# Patient Record
Sex: Female | Born: 1935 | Race: White | Hispanic: No | Smoking: Former smoker
Health system: Southern US, Community
[De-identification: ages and names within clinical notes are randomized; demographics above are authoritative.]

## PROBLEM LIST (undated history)

## (undated) DIAGNOSIS — I10 Essential (primary) hypertension: Secondary | ICD-10-CM

## (undated) DIAGNOSIS — E785 Hyperlipidemia, unspecified: Secondary | ICD-10-CM

## (undated) DIAGNOSIS — E079 Disorder of thyroid, unspecified: Secondary | ICD-10-CM

## (undated) HISTORY — PX: CATARACT EXTRACTION, BILATERAL: SHX1313

## (undated) HISTORY — DX: Essential (primary) hypertension: I10

## (undated) HISTORY — PX: BUNIONECTOMY: SHX129

## (undated) HISTORY — DX: Disorder of thyroid, unspecified: E07.9

## (undated) HISTORY — PX: TONSILLECTOMY AND ADENOIDECTOMY: SHX28

## (undated) HISTORY — DX: Hyperlipidemia, unspecified: E78.5

---
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
## 1985-07-13 HISTORY — PX: ABDOMINAL HYSTERECTOMY: SHX81

## 1998-07-13 DIAGNOSIS — I10 Essential (primary) hypertension: Secondary | ICD-10-CM | POA: Insufficient documentation

## 2006-02-01 ENCOUNTER — Ambulatory Visit: Payer: Self-pay | Admitting: Ophthalmology

## 2006-02-08 ENCOUNTER — Ambulatory Visit: Payer: Self-pay | Admitting: Ophthalmology

## 2006-08-18 ENCOUNTER — Ambulatory Visit: Payer: Self-pay | Admitting: Gastroenterology

## 2006-08-18 LAB — HM COLONOSCOPY

## 2007-03-28 ENCOUNTER — Other Ambulatory Visit: Payer: Self-pay

## 2007-03-28 ENCOUNTER — Ambulatory Visit: Payer: Self-pay | Admitting: Ophthalmology

## 2007-04-04 ENCOUNTER — Ambulatory Visit: Payer: Self-pay | Admitting: Ophthalmology

## 2007-07-14 HISTORY — PX: OTHER SURGICAL HISTORY: SHX169

## 2008-05-22 ENCOUNTER — Ambulatory Visit: Payer: Self-pay | Admitting: Cardiology

## 2008-05-22 ENCOUNTER — Ambulatory Visit: Payer: Self-pay | Admitting: Obstetrics and Gynecology

## 2008-05-28 ENCOUNTER — Ambulatory Visit: Payer: Self-pay | Admitting: Obstetrics and Gynecology

## 2009-02-06 ENCOUNTER — Ambulatory Visit: Payer: Self-pay | Admitting: Family Medicine

## 2011-10-22 ENCOUNTER — Emergency Department: Payer: Self-pay | Admitting: Emergency Medicine

## 2011-10-22 HISTORY — PX: OTHER SURGICAL HISTORY: SHX169

## 2011-10-22 LAB — TROPONIN I: Troponin-I: <0.02 ng/mL

## 2011-10-22 LAB — COMPREHENSIVE METABOLIC PANEL
Albumin: 3.7 g/dL (ref 3.4–5.0)
Alkaline Phosphatase: 50 U/L (ref 50–136)
Anion Gap: 12 (ref 7–16)
BUN: 13 mg/dL (ref 7–18)
Bilirubin,Total: 0.5 mg/dL (ref 0.2–1.0)
Chloride: 99 mmol/L (ref 98–107)
Creatinine: 0.8 mg/dL (ref 0.60–1.30)
EGFR (African American): >60
Osmolality: 279 (ref 275–301)
Potassium: 3 mmol/L — ABNORMAL LOW (ref 3.5–5.1)
SGPT (ALT): 19 U/L
Sodium: 139 mmol/L (ref 136–145)

## 2011-10-22 LAB — CBC
MCH: 29.7 pg (ref 26.0–34.0)
MCHC: 32.2 g/dL (ref 32.0–36.0)
MCV: 92 fL (ref 80–100)
Platelet: 219 10*3/uL (ref 150–440)

## 2011-10-23 ENCOUNTER — Ambulatory Visit: Payer: Self-pay | Admitting: Family Medicine

## 2012-07-26 LAB — HM DEXA SCAN: HM DEXA SCAN: NORMAL

## 2012-08-06 ENCOUNTER — Emergency Department: Payer: Self-pay | Admitting: Emergency Medicine

## 2013-10-25 ENCOUNTER — Ambulatory Visit: Payer: Self-pay | Admitting: Family Medicine

## 2014-04-06 ENCOUNTER — Ambulatory Visit: Payer: Self-pay | Admitting: Family Medicine

## 2014-04-09 LAB — LIPID PANEL
CHOLESTEROL: 210 mg/dL — AB (ref 0–200)
HDL: 81 mg/dL — AB (ref 35–70)
LDL CALC: 113 mg/dL
TRIGLYCERIDES: 81 mg/dL (ref 40–160)

## 2014-04-09 LAB — TSH: TSH: 1.41 u[IU]/mL (ref 0.41–5.90)

## 2014-04-09 LAB — BASIC METABOLIC PANEL
BUN: 10 mg/dL (ref 4–21)
Creatinine: 0.7 mg/dL (ref 0.5–1.1)
Glucose: 97 mg/dL
Potassium: 4.4 mmol/L (ref 3.4–5.3)
Sodium: 137 mmol/L (ref 137–147)

## 2014-11-27 ENCOUNTER — Other Ambulatory Visit: Payer: Self-pay | Admitting: Physical Medicine and Rehabilitation

## 2014-11-27 DIAGNOSIS — M5441 Lumbago with sciatica, right side: Secondary | ICD-10-CM

## 2014-11-27 DIAGNOSIS — M5442 Lumbago with sciatica, left side: Principal | ICD-10-CM

## 2014-12-04 ENCOUNTER — Ambulatory Visit
Admission: RE | Admit: 2014-12-04 | Discharge: 2014-12-04 | Disposition: A | Discharge status: Home / Self Care | Payer: Medicare Other | Source: Ambulatory Visit | Attending: Physical Medicine and Rehabilitation | Admitting: Physical Medicine and Rehabilitation

## 2014-12-04 DIAGNOSIS — M545 Low back pain: Secondary | ICD-10-CM | POA: Diagnosis present

## 2014-12-04 DIAGNOSIS — M79605 Pain in left leg: Secondary | ICD-10-CM | POA: Diagnosis present

## 2014-12-04 DIAGNOSIS — M4806 Spinal stenosis, lumbar region: Secondary | ICD-10-CM | POA: Insufficient documentation

## 2014-12-04 DIAGNOSIS — M5441 Lumbago with sciatica, right side: Secondary | ICD-10-CM

## 2014-12-04 DIAGNOSIS — M5186 Other intervertebral disc disorders, lumbar region: Secondary | ICD-10-CM | POA: Diagnosis not present

## 2014-12-04 DIAGNOSIS — M47896 Other spondylosis, lumbar region: Secondary | ICD-10-CM | POA: Insufficient documentation

## 2014-12-04 DIAGNOSIS — M5386 Other specified dorsopathies, lumbar region: Secondary | ICD-10-CM | POA: Diagnosis not present

## 2014-12-04 DIAGNOSIS — M79604 Pain in right leg: Secondary | ICD-10-CM | POA: Diagnosis present

## 2014-12-04 DIAGNOSIS — M5442 Lumbago with sciatica, left side: Secondary | ICD-10-CM

## 2015-01-01 DIAGNOSIS — M5136 Other intervertebral disc degeneration, lumbar region: Secondary | ICD-10-CM | POA: Insufficient documentation

## 2015-01-01 DIAGNOSIS — M48061 Spinal stenosis, lumbar region without neurogenic claudication: Secondary | ICD-10-CM | POA: Insufficient documentation

## 2015-03-02 ENCOUNTER — Other Ambulatory Visit: Payer: Self-pay | Admitting: Family Medicine

## 2015-04-04 ENCOUNTER — Other Ambulatory Visit: Payer: Self-pay | Admitting: *Deleted

## 2015-04-04 MED ORDER — LEVOTHYROXINE SODIUM 88 MCG PO TABS: 88.0000 ug | ORAL_TABLET | Freq: Every day | ORAL | Status: DC

## 2015-04-04 NOTE — Telephone Encounter (Signed)
Refill request for levothyroxine 88 mcg qd Last filled by MD on- 04/05/2014 #30 x11 Last Appt: 09/28/2014 Next Appt: none Please advise refill?

## 2015-06-20 ENCOUNTER — Other Ambulatory Visit: Payer: Self-pay | Admitting: Family Medicine

## 2015-09-16 DIAGNOSIS — M545 Low back pain, unspecified: Secondary | ICD-10-CM | POA: Insufficient documentation

## 2015-09-16 DIAGNOSIS — G4700 Insomnia, unspecified: Secondary | ICD-10-CM | POA: Insufficient documentation

## 2015-09-16 DIAGNOSIS — E876 Hypokalemia: Secondary | ICD-10-CM | POA: Insufficient documentation

## 2015-09-16 DIAGNOSIS — M4316 Spondylolisthesis, lumbar region: Secondary | ICD-10-CM | POA: Insufficient documentation

## 2015-09-16 DIAGNOSIS — E039 Hypothyroidism, unspecified: Secondary | ICD-10-CM | POA: Insufficient documentation

## 2015-09-16 DIAGNOSIS — M1990 Unspecified osteoarthritis, unspecified site: Secondary | ICD-10-CM | POA: Insufficient documentation

## 2015-09-17 ENCOUNTER — Encounter: Payer: Self-pay | Admitting: Family Medicine

## 2015-09-17 ENCOUNTER — Ambulatory Visit (INDEPENDENT_AMBULATORY_CARE_PROVIDER_SITE_OTHER): Payer: Medicare Other | Admitting: Family Medicine

## 2015-09-17 VITALS — BP 128/60 | HR 64 | Temp 97.7°F | Resp 16 | Ht 62.0 in | Wt 115.0 lb

## 2015-09-17 DIAGNOSIS — E039 Hypothyroidism, unspecified: Secondary | ICD-10-CM

## 2015-09-17 DIAGNOSIS — I10 Essential (primary) hypertension: Secondary | ICD-10-CM

## 2015-09-17 NOTE — Progress Notes (Signed)
Patient: Isabel Myers Female    DOB: May 29, 1930   82 y.o.   MRN: [id number] Visit Date: 09/17/2015  Today's Provider: Mila Merry, MD   Chief Complaint  Patient presents with  . Hypothyroidism    follow up  . Hypertension    follow up   Subjective:    HPI  Hypertension, follow-up:  BP Readings from Last 3 Encounters:  09/17/15 128/60  09/28/14 120/60    She was last seen for hypertension 1 years ago.  BP at that visit was 120/60. Management since that visit includes no changes. She reports good compliance with treatment. She is not having side effects.  She is exercising. She is adherent to low salt diet.   Outside blood pressures are not being checked. She is experiencing none.  Patient denies chest pain, chest pressure/discomfort, claudication, dyspnea, exertional chest pressure/discomfort, fatigue, irregular heart beat, lower extremity edema, near-syncope, orthopnea, palpitations, paroxysmal nocturnal dyspnea, syncope and tachypnea.   Cardiovascular risk factors include advanced age (older than 82 for men, 104 for women) and hypertension.      Weight trend: stable Wt Readings from Last 3 Encounters:  09/17/15 115 lb (52.164 kg)  09/28/14 112 lb (50.803 kg)    Current diet: in general, a "healthy" diet    ------------------------------------------------------------------------  Follow up Hypothyroidism:  Last office visit was 1 year ago and no changes were made. Patient reports good compliance with treatment.     No Known Allergies Previous Medications   AMLODIPINE (NORVASC) 2.5 MG TABLET    TAKE 1 TABLET EVERY DAY   CALCIUM CARBONATE (CALCIUM 600 PO)    Take 1 tablet by mouth daily.   HYDROCHLOROTHIAZIDE (HYDRODIURIL) 25 MG TABLET    TAKE 1 TABLET EVERY DAY   LEVOTHYROXINE (SYNTHROID, LEVOTHROID) 88 MCG TABLET    Take 1 tablet (88 mcg total) by mouth daily.   TRIAZOLAM (HALCION) 0.25 MG TABLET    Take 0.5-1 tablets by mouth at bedtime as  needed.    Review of Systems  Constitutional: Negative for fever, chills, appetite change and fatigue.  Respiratory: Negative for chest tightness and shortness of breath.   Cardiovascular: Negative for chest pain, palpitations and leg swelling.  Gastrointestinal: Negative for nausea, vomiting and abdominal pain.  Musculoskeletal: Positive for back pain.  Neurological: Negative for dizziness, weakness and headaches.    Social History  Substance Use Topics  . Smoking status: Former Smoker -- 0.50 packs/day for 15 years    Types: Cigarettes  . Smokeless tobacco: Not on file  . Alcohol Use: 0.0 oz/week    0 Standard drinks or equivalent per week     Comment: drinks wine most days   Objective:   BP 128/60 mmHg  Pulse 64  Temp(Src) 97.7 F (36.5 C) (Oral)  Resp 16  Ht  (1.575 m)  Wt 115 lb (52.164 kg)  BMI 21.03 kg/m2  SpO2 97%  Physical Exam   General Appearance:    Alert, cooperative, no distress  Eyes:    PERRL, conjunctiva/corneas clear, EOM's intact       Lungs:     Clear to auscultation bilaterally, respirations unlabored  Heart:    Regular rate and rhythm  Neurologic:   Awake, alert, oriented x 3. No apparent focal neurological           defect.           Assessment & Plan:     1. Essential (primary) hypertension Well controlled.  Continue current medications.   - Renal function panel  2. Hypothyroidism, unspecified hypothyroidism type Feels well on current dose of levothyroxine.  - TSH       Mila Merryonald Fisher, MD  [facility]

## 2015-09-18 LAB — RENAL FUNCTION PANEL
Albumin: 4.5 g/dL (ref 3.5–4.7)
BUN / CREAT RATIO: 16 (ref 11–26)
BUN: 13 mg/dL (ref 8–27)
CALCIUM: 9.9 mg/dL (ref 8.7–10.3)
CO2: 26 mmol/L (ref 18–29)
CREATININE: 0.79 mg/dL (ref 0.57–1.00)
Chloride: 93 mmol/L — ABNORMAL LOW (ref 96–106)
GFR calc Af Amer: 80 mL/min/{1.73_m2} (ref >59)
GFR calc non Af Amer: 69 mL/min/{1.73_m2} (ref >59)
Glucose: 87 mg/dL (ref 65–99)
Phosphorus: 3.7 mg/dL (ref 2.5–4.5)
Potassium: 4.4 mmol/L (ref 3.5–5.2)
Sodium: 138 mmol/L (ref 134–144)

## 2015-09-18 LAB — TSH: TSH: 1.15 u[IU]/mL (ref 0.450–4.500)

## 2015-09-20 ENCOUNTER — Encounter: Payer: Self-pay | Admitting: Family Medicine

## 2015-10-07 ENCOUNTER — Encounter: Payer: Self-pay | Admitting: Family Medicine

## 2015-10-07 ENCOUNTER — Ambulatory Visit (INDEPENDENT_AMBULATORY_CARE_PROVIDER_SITE_OTHER): Payer: Medicare Other | Admitting: Family Medicine

## 2015-10-07 VITALS — BP 110/52 | HR 64 | Temp 98.0°F | Resp 16 | Wt 116.0 lb

## 2015-10-07 DIAGNOSIS — M545 Low back pain, unspecified: Secondary | ICD-10-CM

## 2015-10-07 DIAGNOSIS — R509 Fever, unspecified: Secondary | ICD-10-CM | POA: Diagnosis not present

## 2015-10-07 LAB — POCT INFLUENZA A/B
INFLUENZA A, POC: NEGATIVE
Influenza B, POC: NEGATIVE

## 2015-10-07 LAB — POCT URINALYSIS DIPSTICK
BILIRUBIN UA: NEGATIVE
Glucose, UA: NEGATIVE
Ketones, UA: NEGATIVE
Leukocytes, UA: NEGATIVE
NITRITE UA: NEGATIVE
PH UA: 6
PROTEIN UA: NEGATIVE
SPEC GRAV UA: 1.01
UROBILINOGEN UA: 0.2

## 2015-10-07 NOTE — Progress Notes (Signed)
Patient: Isabel Myers Female    DOB: Mar 27, 1930   82 y.o.   MRN: [id number] Visit Date: 10/07/2015  Today's Provider: Mila Merry, MD   Chief Complaint  Patient presents with  . Back Pain   Subjective:    Back Pain This is a new problem. Episode onset: 2-3 days ago. The problem occurs constantly. The problem has been gradually worsening since onset. Quality: sharp. The pain does not radiate. The pain is severe. The symptoms are aggravated by twisting and bending. Associated symptoms include a fever (up to 102 yesterday). Pertinent negatives include no abdominal pain, bladder incontinence, bowel incontinence, chest pain, dysuria, headaches, leg pain, numbness, paresis, paresthesias, pelvic pain, tingling or weakness. Treatments tried: Tylenol. The treatment provided mild relief.  Back feels better today since getting up and around. Took some Tylenol yesterday but none today.      No Known Allergies Previous Medications   AMLODIPINE (NORVASC) 2.5 MG TABLET    TAKE 1 TABLET EVERY DAY   CALCIUM CARBONATE (CALCIUM 600 PO)    Take 1 tablet by mouth daily.   HYDROCHLOROTHIAZIDE (HYDRODIURIL) 25 MG TABLET    TAKE 1 TABLET EVERY DAY   LEVOTHYROXINE (SYNTHROID, LEVOTHROID) 88 MCG TABLET    Take 1 tablet (88 mcg total) by mouth daily.   TRIAZOLAM (HALCION) 0.25 MG TABLET    Take 0.5-1 tablets by mouth at bedtime as needed.    Review of Systems  Constitutional: Positive for fever (up to 102 yesterday). Negative for chills, diaphoresis, appetite change and fatigue.  Respiratory: Positive for cough. Negative for chest tightness and shortness of breath.   Cardiovascular: Negative for chest pain and palpitations.  Gastrointestinal: Negative for nausea, vomiting, abdominal pain and bowel incontinence.  Genitourinary: Negative for bladder incontinence, dysuria and pelvic pain.  Musculoskeletal: Positive for back pain.  Neurological: Negative for dizziness, tingling, weakness, numbness,  headaches and paresthesias.    Social History  Substance Use Topics  . Smoking status: Former Smoker -- 0.50 packs/day for 15 years    Types: Cigarettes  . Smokeless tobacco: Not on file     Comment: Quit in the 1970s  . Alcohol Use: 0.0 oz/week    0 Standard drinks or equivalent per week     Comment: drinks wine most days   Objective:   BP 110/52 mmHg  Pulse 64  Temp(Src) 98 F (36.7 C) (Oral)  Resp 16  Wt 116 lb (52.617 kg)  SpO2 98%  Physical Exam  General Appearance:    Alert, cooperative, no distress  HENT:   ENT exam normal, no neck nodes or sinus tenderness  Eyes:    PERRL, conjunctiva/corneas clear, EOM's intact       Lungs:     Clear to auscultation bilaterally, respirations unlabored  Heart:    Regular rate and rhythm  Neurologic:   Awake, alert, oriented x 3. No apparent focal neurological           defect.         Results for orders placed or performed in visit on 10/07/15  POCT urinalysis dipstick  Result Value Ref Range   Color, UA yellow    Clarity, UA clear    Glucose, UA negative    Bilirubin, UA negative    Ketones, UA negative    Spec Grav, UA 1.010    Blood, UA trace (non hemolyzed)    pH, UA 6.0    Protein, UA negative    Urobilinogen,  UA 0.2    Nitrite, UA negative    Leukocytes, UA Negative Negative  POCT Influenza A/B  Result Value Ref Range   Influenza A, POC Negative Negative   Influenza B, POC Negative Negative       Assessment & Plan:     1. Fever, unspecified fever cause Resolved today. Most likely brief mild respiratory virus. Call if any more fever, coughing, or shortness of breath.  - POCT Influenza A/B  2. Midline low back pain without sciatica Normal exam. Mostly resolved today. Probably related to fever yesterday. Call if it recurs.  - POCT urinalysis dipstick        Mila Merryonald Fisher, MD  [facility]

## 2015-10-10 ENCOUNTER — Other Ambulatory Visit: Payer: Self-pay | Admitting: Family Medicine

## 2016-02-20 ENCOUNTER — Other Ambulatory Visit: Payer: Self-pay | Admitting: Family Medicine

## 2016-03-03 ENCOUNTER — Encounter: Payer: Self-pay | Admitting: Family Medicine

## 2016-03-03 ENCOUNTER — Ambulatory Visit (INDEPENDENT_AMBULATORY_CARE_PROVIDER_SITE_OTHER): Payer: Medicare Other | Admitting: Family Medicine

## 2016-03-03 VITALS — BP 130/60 | HR 64 | Temp 97.7°F | Resp 16 | Wt 114.0 lb

## 2016-03-03 DIAGNOSIS — I10 Essential (primary) hypertension: Secondary | ICD-10-CM | POA: Diagnosis not present

## 2016-03-03 DIAGNOSIS — E039 Hypothyroidism, unspecified: Secondary | ICD-10-CM

## 2016-03-03 NOTE — Progress Notes (Signed)
Patient: Isabel Myers Female    DOB: Oct 02, 1929   82 y.o.   MRN: [id number] Visit Date: 03/03/2016  Today's Provider: Mila Merryonald Meoshia Billing, MD   Chief Complaint  Patient presents with  . Hypertension    follow  up  . Hypothyroidism    follow  up   Subjective:    HPI  Hypertension, follow-up:  BP Readings from Last 3 Encounters:  10/07/15 (!) 110/52  09/17/15 128/60  09/28/14 120/60    She was last seen for hypertension 5 months ago.  BP at that visit was 128/60. Management since that visit includes no changes. She reports good compliance with treatment. She is not having side effects.  She is exercising. She is adherent to low salt diet.   Outside blood pressures are being checked at the pharmacy. She is experiencing none.  Patient denies chest pain, chest pressure/discomfort, claudication, dyspnea, exertional chest pressure/discomfort, fatigue, irregular heart beat, lower extremity edema, near-syncope, orthopnea, palpitations, paroxysmal nocturnal dyspnea, syncope and tachypnea.   Cardiovascular risk factors include advanced age (older than 6855 for men, 6365 for women) and hypertension.  Use of agents associated with hypertension: thyroid hormones.     Weight trend: stable Wt Readings from Last 3 Encounters:  10/07/15 116 lb (52.6 kg)  09/17/15 115 lb (52.2 kg)  09/28/14 112 lb (50.8 kg)    Current diet: in general, a "healthy" diet    ------------------------------------------------------------------------ Follow up Hypothyroidism: Patient was last seen 5 months ago and no changes were made.  Patient reports good compliance with treatment and good tolerance.  Lab Results  Component Value Date   TSH 1.150 09/17/2015       No Known Allergies Current Meds  Medication Sig  . amLODipine (NORVASC) 2.5 MG tablet TAKE 1 TABLET EVERY DAY  . Calcium Carbonate (CALCIUM 600 PO) Take 1 tablet by mouth daily.  . hydrochlorothiazide (HYDRODIURIL) 25 MG tablet TAKE  1 TABLET EVERY DAY  . levothyroxine (SYNTHROID, LEVOTHROID) 88 MCG tablet TAKE 1 TABLET EVERY DAY  . triazolam (HALCION) 0.25 MG tablet Take 0.5-1 tablets by mouth at bedtime as needed.    Review of Systems  Constitutional: Negative for appetite change, chills, fatigue and fever.  Respiratory: Negative for chest tightness and shortness of breath.   Cardiovascular: Negative for chest pain and palpitations.  Gastrointestinal: Negative for abdominal pain, nausea and vomiting.  Endocrine: Negative for cold intolerance, heat intolerance, polydipsia, polyphagia and polyuria.  Musculoskeletal: Positive for arthralgias.  Neurological: Negative for dizziness and weakness.    Social History  Substance Use Topics  . Smoking status: Former Smoker    Packs/day: 0.50    Years: 15.00    Types: Cigarettes  . Smokeless tobacco: Never Used     Comment: Quit in the 1970s  . Alcohol use 0.0 oz/week     Comment: drinks wine most days   Objective:   BP 130/60 (BP Location: Left Arm, Patient Position: Sitting, Cuff Size: Normal)   Pulse 64   Temp 97.7 F (36.5 C) (Oral)   Resp 16   Wt 114 lb (51.7 kg)   BMI 20.85 kg/m   Physical Exam   General Appearance:    Alert, cooperative, no distress  Eyes:    PERRL, conjunctiva/corneas clear, EOM's intact       Lungs:     Clear to auscultation bilaterally, respirations unlabored  Heart:    Regular rate and rhythm  Neurologic:   Awake, alert, oriented x 3.  No apparent focal neurological           defect.        Fall Risk  03/03/2016  Falls in the past year? No   Depression screen PHQ 2/9 03/03/2016  Decreased Interest 0  Down, Depressed, Hopeless 0  PHQ - 2 Score 0       Assessment & Plan:     1. Hypothyroidism, unspecified hypothyroidism type Doing well Continue current medications.    2. Essential (primary) hypertension Well controlled.  Continue current medications.    Follow up in March 2018.      The entirety of the information  documented in the History of Present Illness, Review of Systems and Physical Exam were personally obtained by me. Portions of this information were initially documented by Awilda Billoshena Chambers, CMA and reviewed by me for thoroughness and accuracy.    Mila Merryonald Dyron Kawano, MD  [facility]

## 2016-04-09 ENCOUNTER — Other Ambulatory Visit: Payer: Self-pay | Admitting: Family Medicine

## 2016-04-09 NOTE — Telephone Encounter (Signed)
Refill request for amlodipine 2.05 mg qd Last filled by MD on- 03/02/2015 #30 x6 Last Appt: 03/03/2016 Next Appt: 09/01/2016 Please advise refill?

## 2016-04-28 ENCOUNTER — Telehealth: Payer: Self-pay | Admitting: Family Medicine

## 2016-04-28 NOTE — Telephone Encounter (Signed)
Called Pt to schedule AWV with [facility] only - knb °

## 2016-04-29 NOTE — Telephone Encounter (Signed)
Patient called back and declined scheduling AWV appointment.

## 2016-08-20 ENCOUNTER — Telehealth: Payer: Self-pay | Admitting: Family Medicine

## 2016-08-20 NOTE — Telephone Encounter (Signed)
Called Pt to schedule AWV with [facility] - knb °

## 2016-08-20 NOTE — Telephone Encounter (Signed)
Pt was confused about the AWV and the F/U appt that she already has scheduled. I tried to explain but it seemed to be confusing pt more. I advised pt I would leave her F/U appt as it was. Maybe when she comes in later this month someone in the office could try to get her schedule. I was afraid to get her more confused. Thanks TNP

## 2016-09-01 ENCOUNTER — Ambulatory Visit (INDEPENDENT_AMBULATORY_CARE_PROVIDER_SITE_OTHER): Payer: Medicare Other | Admitting: Family Medicine

## 2016-09-01 ENCOUNTER — Encounter: Payer: Self-pay | Admitting: Family Medicine

## 2016-09-01 VITALS — BP 130/60 | HR 78 | Temp 97.7°F | Resp 16 | Ht 62.0 in | Wt 116.0 lb

## 2016-09-01 DIAGNOSIS — E039 Hypothyroidism, unspecified: Secondary | ICD-10-CM | POA: Diagnosis not present

## 2016-09-01 DIAGNOSIS — I10 Essential (primary) hypertension: Secondary | ICD-10-CM

## 2016-09-01 NOTE — Progress Notes (Signed)
       Patient: Isabel GrumblesKathryn E Woodmansee Female    DOB: Mar 27, 1930   83 y.o.   MRN: [id number] Visit Date: 09/01/2016  Today's Provider: Mila Merryonald Foch Rosenwald, MD   Chief Complaint  Patient presents with  . Follow-up  . Hypertension  . Hypothyroidism   Subjective:    HPI   Hypothyroidism, unspecified hypothyroidism type From 03/03/2016-no changes.    Hypertension, follow-up:  BP Readings from Last 3 Encounters:  09/01/16 130/60  03/03/16 130/60  10/07/15 (!) 110/52    She was last seen for hypertension 6 months ago.  BP at that visit was 130/60. Management since that visit includes; no changes.She reports good compliance with treatment. She is not having side effects. none She is exercising. She is adherent to low salt diet.   Outside blood pressures are not checking. She is experiencing none.  Patient denies none.   Cardiovascular risk factors include none.  Use of agents associated with hypertension: none.   ----------------------------------------------------------------    No Known Allergies   Current Outpatient Prescriptions:  .  amLODipine (NORVASC) 2.5 MG tablet, TAKE 1 TABLET EVERY DAY, Disp: 30 tablet, Rfl: 6 .  Calcium Carbonate (CALCIUM 600 PO), Take 1 tablet by mouth daily., Disp: , Rfl:  .  hydrochlorothiazide (HYDRODIURIL) 25 MG tablet, TAKE 1 TABLET EVERY DAY, Disp: 30 tablet, Rfl: 12 .  levothyroxine (SYNTHROID, LEVOTHROID) 88 MCG tablet, TAKE 1 TABLET EVERY DAY, Disp: 30 tablet, Rfl: 12 .  triazolam (HALCION) 0.25 MG tablet, Take 0.5-1 tablets by mouth at bedtime as needed., Disp: , Rfl:   Review of Systems  Constitutional: Negative for appetite change, chills, fatigue and fever.  Respiratory: Negative for chest tightness and shortness of breath.   Cardiovascular: Negative for chest pain and palpitations.  Gastrointestinal: Negative for abdominal pain, nausea and vomiting.  Neurological: Positive for speech difficulty. Negative for dizziness and weakness.      Social History  Substance Use Topics  . Smoking status: Former Smoker    Packs/day: 0.50    Years: 15.00    Types: Cigarettes  . Smokeless tobacco: Never Used     Comment: Quit in the 1970s  . Alcohol use 0.0 oz/week     Comment: drinks wine most days   Objective:   BP 130/60 (BP Location: Right Arm, Patient Position: Sitting, Cuff Size: Normal)   Pulse 78   Temp 97.7 F (36.5 C) (Oral)   Resp 16   Ht 5\' 2"  (1.575 m)   Wt 116 lb (52.6 kg)   SpO2 98%   BMI 21.22 kg/m   Physical Exam   General Appearance:    Alert, cooperative, no distress  Eyes:    PERRL, conjunctiva/corneas clear, EOM's intact       Lungs:     Clear to auscultation bilaterally, respirations unlabored  Heart:    Regular rate and rhythm  Neurologic:   Awake, alert, oriented x 3. No apparent focal neurological           defect.           Assessment & Plan:     1. Essential (primary) hypertension  - Renal function panel  2. Hypothyroidism, unspecified type  - TSH       Mila Merryonald Chelbie Jarnagin, MD  [facility]

## 2016-09-02 LAB — RENAL FUNCTION PANEL
Albumin: 4.5 g/dL (ref 3.5–4.7)
BUN / CREAT RATIO: 19 (ref 12–28)
BUN: 15 mg/dL (ref 8–27)
CHLORIDE: 93 mmol/L — AB (ref 96–106)
CO2: 28 mmol/L (ref 18–29)
Calcium: 10 mg/dL (ref 8.7–10.3)
Creatinine, Ser: 0.79 mg/dL (ref 0.57–1.00)
GFR calc Af Amer: 79 (ref >59)
GFR calc non Af Amer: 69 (ref >59)
GLUCOSE: 97 mg/dL (ref 65–99)
POTASSIUM: 3.9 mmol/L (ref 3.5–5.2)
Phosphorus: 3.6 mg/dL (ref 2.5–4.5)
Sodium: 137 mmol/L (ref 134–144)

## 2016-09-02 LAB — TSH: TSH: 1.01 u[IU]/mL (ref 0.450–4.500)

## 2016-09-02 NOTE — Progress Notes (Signed)
Advised  ED 

## 2016-09-23 ENCOUNTER — Telehealth: Payer: Self-pay | Admitting: Family Medicine

## 2016-09-23 NOTE — Telephone Encounter (Signed)
Called Pt to schedule AWV with [facility] - knb °

## 2016-11-12 ENCOUNTER — Other Ambulatory Visit: Payer: Self-pay | Admitting: Family Medicine

## 2017-03-24 ENCOUNTER — Other Ambulatory Visit: Payer: Self-pay | Admitting: Family Medicine

## 2017-04-15 ENCOUNTER — Other Ambulatory Visit: Payer: Self-pay | Admitting: Family Medicine

## 2017-05-31 ENCOUNTER — Ambulatory Visit (INDEPENDENT_AMBULATORY_CARE_PROVIDER_SITE_OTHER): Payer: Medicare Other

## 2017-05-31 VITALS — BP 138/64 | HR 76 | Temp 97.7°F | Ht 62.0 in | Wt 115.6 lb

## 2017-05-31 DIAGNOSIS — Z0000 Encounter for general adult medical examination without abnormal findings: Secondary | ICD-10-CM

## 2017-05-31 NOTE — Progress Notes (Signed)
Subjective:   Isabel GrumblesKathryn E Myers is a 83 y.o. female who presents for Medicare Annual (Subsequent) preventive examination.  Review of Systems:  N/A  Cardiac Risk Factors include: advanced age (>7655men, 51>65 women);hypertension     Objective:     Vitals: BP 138/64 (BP Location: Left Arm)   Pulse 76   Temp 97.7 F (36.5 C) (Oral)   Ht 5\' 2"  (1.575 m)   Wt 115 lb 9.6 oz (52.4 kg)   BMI 21.14 kg/m   Body mass index is 21.14 kg/m.   Tobacco Social History   Tobacco Use  Smoking Status Former Smoker  . Packs/day: 0.50  . Years: 15.00  . Pack years: 7.50  . Types: Cigarettes  Smokeless Tobacco Never Used  Tobacco Comment   Quit in the 1970s     Counseling given: Not Answered Comment: Quit in the 1970s   Past Medical History:  Diagnosis Date  . Hypertension   . Thyroid disease    hypothyroid   Past Surgical History:  Procedure Laterality Date  . ABDOMINAL HYSTERECTOMY  1987   with BSO  . Bladder tack  2009   Dr. Chrissie Noaefranchesco  . CT Cervical Spine  10/22/2011   negative  . CT Scan of head  10/22/2011   without contrast, [facility], Normal  . THYROIDECTOMY  1937   SUBTOTAL  . TONSILLECTOMY AND ADENOIDECTOMY     Family History  Problem Relation Age of Onset  . Memory loss Sister        short term memory loss   Social History   Substance and Sexual Activity  Sexual Activity Not on file    Outpatient Encounter Medications as of 05/31/2017  Medication Sig  . amLODipine (NORVASC) 2.5 MG tablet TAKE 1 TABLET EVERY DAY  . Calcium Carbonate (CALCIUM 600 PO) Take 1 tablet by mouth daily.  Marland Kitchen. levothyroxine (SYNTHROID, LEVOTHROID) 88 MCG tablet TAKE 1 TABLET EVERY DAY  . triazolam (HALCION) 0.25 MG tablet Take 0.5-1 tablets by mouth at bedtime as needed.  . hydrochlorothiazide (HYDRODIURIL) 25 MG tablet TAKE 1 TABLET BY MOUTH DAILY   No facility-administered encounter medications on file as of 05/31/2017.     Activities of Daily Living In your present state of  health, do you have any difficulty performing the following activities: 05/31/2017  Hearing? N  Vision? N  Difficulty concentrating or making decisions? N  Walking or climbing stairs? Y  Comment due to arthritis pain  Dressing or bathing? N  Doing errands, shopping? N  Preparing Food and eating ? N  Using the Toilet? N  In the past six months, have you accidently leaked urine? N  Do you have problems with loss of bowel control? N  Managing your Medications? N  Managing your Finances? N  Housekeeping or managing your Housekeeping? N  Some recent data might be hidden    Patient Care Team: Malva LimesFisher, Donald E, MD as PCP - General (Family Medicine) Dingeldein, Viviann SpareSteven, MD (Ophthalmology)    Assessment:     Exercise Activities and Dietary recommendations Current Exercise Habits: Home exercise routine, Type of exercise: walking;stretching, Time (Minutes): 25, Frequency (Times/Week): 7, Weekly Exercise (Minutes/Week): 175, Intensity: Mild, Exercise limited by: orthopedic condition(s)  Goals    . DIET - WATER INTAKE     Continue drinking 6-8 glasses of water a day.       Fall Risk Fall Risk  05/31/2017 03/03/2016  Falls in the past year? No No   Depression Screen PHQ 2/9 Scores  05/31/2017 05/31/2017 03/03/2016  PHQ - 2 Score 0 0 0  PHQ- 9 Score 0 - -     Cognitive Function     6CIT Screen 05/31/2017  What Year? 0 points  What time? 0 points  Count back from 20 0 points  Months in reverse 0 points  Repeat phrase 0 points    Immunization History  Administered Date(s) Administered  . DTaP 07/28/1993  . Influenza-Unspecified 04/12/2016  . Pneumococcal Conjugate-13 04/06/2014  . Pneumococcal Polysaccharide-23 07/28/2006  . Td 08/09/2007  . Tdap 11/30/2011  . Zoster 03/05/2006   Screening Tests Health Maintenance  Topic Date Due  . TETANUS/TDAP  11/29/2021  . INFLUENZA VACCINE  Completed  . DEXA SCAN  Completed  . PNA vac Low Risk Adult  Completed      Plan:  I  have personally reviewed and addressed the Medicare Annual Wellness questionnaire and have noted the following in the patient's chart:  A. Medical and social history B. Use of alcohol, tobacco or illicit drugs  C. Current medications and supplements D. Functional ability and status E.  Nutritional status F.  Physical activity G. Advance directives H. List of other physicians I.  Hospitalizations, surgeries, and ER visits in previous 12 months J.  Vitals K. Screenings such as hearing and vision if needed, cognitive and depression L. Referrals and appointments - none  In addition, I have reviewed and discussed with patient certain preventive protocols, quality metrics, and best practice recommendations. A written personalized care plan for preventive services as well as general preventive health recommendations were provided to patient.  See attached scanned questionnaire for additional information.   Signed,  Hyacinth MeekerMckenzie Prateek Knipple, LPN Nurse Health Advisor   MD Recommendations:

## 2017-05-31 NOTE — Patient Instructions (Signed)
Ms. Larinda ButteryJessup , Thank you for taking time to come for your Medicare Wellness Visit. I appreciate your ongoing commitment to your health goals. Please review the following plan we discussed and let me know if I can assist you in the future.   Screening recommendations/referrals: Colonoscopy: no longer required Mammogram: no longer required Bone Density: up to date Recommended yearly ophthalmology/optometry visit for glaucoma screening and checkup Recommended yearly dental visit for hygiene and checkup  Vaccinations: Influenza vaccine: completed Pneumococcal vaccine: completed series Tdap vaccine: up to date Shingles vaccine: completed in 2007  Advanced directives: Please bring a copy of your POA (Power of [place]) and/or Living Will to your next appointment.   Conditions/risks identified: Continue drinking 6-8 glasses of water a day.   Next appointment: 08/16/17   Preventive Care 65 Years and Older, Female Preventive care refers to lifestyle choices and visits with your health care provider that can promote health and wellness. What does preventive care include?  A yearly physical exam. This is also called an annual well check.  Dental exams once or twice a year.  Routine eye exams. Ask your health care provider how often you should have your eyes checked.  Personal lifestyle choices, including:  Daily care of your teeth and gums.  Regular physical activity.  Eating a healthy diet.  Avoiding tobacco and drug use.  Limiting alcohol use.  Practicing safe sex.  Taking low-dose aspirin every day.  Taking vitamin and mineral supplements as recommended by your health care provider. What happens during an annual well check? The services and screenings done by your health care provider during your annual well check will depend on your age, overall health, lifestyle risk factors, and family history of disease. Counseling  Your health care provider may ask you questions about  your:  Alcohol use.  Tobacco use.  Drug use.  Emotional well-being.  Home and relationship well-being.  Sexual activity.  Eating habits.  History of falls.  Memory and ability to understand (cognition).  Work and work Astronomerenvironment.  Reproductive health. Screening  You may have the following tests or measurements:  Height, weight, and BMI.  Blood pressure.  Lipid and cholesterol levels. These may be checked every 5 years, or more frequently if you are over 727 years old.  Skin check.  Lung cancer screening. You may have this screening every year starting at age 81 if you have a 30-pack-year history of smoking and currently smoke or have quit within the past 15 years.  Fecal occult blood test (FOBT) of the stool. You may have this test every year starting at age 81.  Flexible sigmoidoscopy or colonoscopy. You may have a sigmoidoscopy every 5 years or a colonoscopy every 10 years starting at age 81.  Hepatitis C blood test.  Hepatitis B blood test.  Sexually transmitted disease (STD) testing.  Diabetes screening. This is done by checking your blood sugar (glucose) after you have not eaten for a while (fasting). You may have this done every 1-3 years.  Bone density scan. This is done to screen for osteoporosis. You may have this done starting at age 81.  Mammogram. This may be done every 1-2 years. Talk to your health care provider about how often you should have regular mammograms. Talk with your health care provider about your test results, treatment options, and if necessary, the need for more tests. Vaccines  Your health care provider may recommend certain vaccines, such as:  Influenza vaccine. This is recommended every year.  Tetanus, diphtheria, and acellular pertussis (Tdap, Td) vaccine. You may need a Td booster every 10 years.  Zoster vaccine. You may need this after age 82.  Pneumococcal 13-valent conjugate (PCV13) vaccine. One dose is recommended  after age 74.  Pneumococcal polysaccharide (PPSV23) vaccine. One dose is recommended after age 59. Talk to your health care provider about which screenings and vaccines you need and how often you need them. This information is not intended to replace advice given to you by your health care provider. Make sure you discuss any questions you have with your health care provider. Document Released: 07/26/2015 Document Revised: 03/18/2016 Document Reviewed: 04/30/2015 Elsevier Interactive Patient Education  2017 [place] Prevention in the Home Falls can cause injuries. They can happen to people of all ages. There are many things you can do to make your home safe and to help prevent falls. What can I do on the outside of my home?  Regularly fix the edges of walkways and driveways and fix any cracks.  Remove anything that might make you trip as you walk through a door, such as a raised step or threshold.  Trim any bushes or trees on the path to your home.  Use bright outdoor lighting.  Clear any walking paths of anything that might make someone trip, such as rocks or tools.  Regularly check to see if handrails are loose or broken. Make sure that both sides of any steps have handrails.  Any raised decks and porches should have guardrails on the edges.  Have any leaves, snow, or ice cleared regularly.  Use sand or salt on walking paths during winter.  Clean up any spills in your garage right away. This includes oil or grease spills. What can I do in the bathroom?  Use night lights.  Install grab bars by the toilet and in the tub and shower. Do not use towel bars as grab bars.  Use non-skid mats or decals in the tub or shower.  If you need to sit down in the shower, use a plastic, non-slip stool.  Keep the floor dry. Clean up any water that spills on the floor as soon as it happens.  Remove soap buildup in the tub or shower regularly.  Attach bath mats securely with  double-sided non-slip rug tape.  Do not have throw rugs and other things on the floor that can make you trip. What can I do in the bedroom?  Use night lights.  Make sure that you have a light by your bed that is easy to reach.  Do not use any sheets or blankets that are too big for your bed. They should not hang down onto the floor.  Have a firm chair that has side arms. You can use this for support while you get dressed.  Do not have throw rugs and other things on the floor that can make you trip. What can I do in the kitchen?  Clean up any spills right away.  Avoid walking on wet floors.  Keep items that you use a lot in easy-to-reach places.  If you need to reach something above you, use a strong step stool that has a grab bar.  Keep electrical cords out of the way.  Do not use floor polish or wax that makes floors slippery. If you must use wax, use non-skid floor wax.  Do not have throw rugs and other things on the floor that can make you trip. What can I do  with my stairs?  Do not leave any items on the stairs.  Make sure that there are handrails on both sides of the stairs and use them. Fix handrails that are broken or loose. Make sure that handrails are as long as the stairways.  Check any carpeting to make sure that it is firmly attached to the stairs. Fix any carpet that is loose or worn.  Avoid having throw rugs at the top or bottom of the stairs. If you do have throw rugs, attach them to the floor with carpet tape.  Make sure that you have a light switch at the top of the stairs and the bottom of the stairs. If you do not have them, ask someone to add them for you. What else can I do to help prevent falls?  Wear shoes that:  Do not have high heels.  Have rubber bottoms.  Are comfortable and fit you well.  Are closed at the toe. Do not wear sandals.  If you use a stepladder:  Make sure that it is fully opened. Do not climb a closed stepladder.  Make  sure that both sides of the stepladder are locked into place.  Ask someone to hold it for you, if possible.  Clearly mark and make sure that you can see:  Any grab bars or handrails.  First and last steps.  Where the edge of each step is.  Use tools that help you move around (mobility aids) if they are needed. These include:  Canes.  Walkers.  Scooters.  Crutches.  Turn on the lights when you go into a dark area. Replace any light bulbs as soon as they burn out.  Set up your furniture so you have a clear path. Avoid moving your furniture around.  If any of your floors are uneven, fix them.  If there are any pets around you, be aware of where they are.  Review your medicines with your doctor. Some medicines can make you feel dizzy. This can increase your chance of falling. Ask your doctor what other things that you can do to help prevent falls. This information is not intended to replace advice given to you by your health care provider. Make sure you discuss any questions you have with your health care provider. Document Released: 04/25/2009 Document Revised: 12/05/2015 Document Reviewed: 08/03/2014 Elsevier Interactive Patient Education  2017 [employer].

## 2017-08-09 ENCOUNTER — Other Ambulatory Visit: Payer: Self-pay | Admitting: Family Medicine

## 2017-08-09 MED ORDER — LEVOTHYROXINE SODIUM 88 MCG PO TABS: 88.0000 ug | ORAL_TABLET | Freq: Every day | ORAL | 11 refills | Status: DC

## 2017-08-09 NOTE — Telephone Encounter (Signed)
Pt requesting levothyroxine 88MCG sent to [facility] on [address] [phone]

## 2017-08-11 NOTE — Telephone Encounter (Signed)
Pt actually needed this medication called into [facility] [address]  Because [facility] has closed.

## 2017-08-11 NOTE — Telephone Encounter (Signed)
Patient advised RX had been sent to [facility] on 08/10/17. Patient states that is the pharmacy she requested [facility].

## 2017-08-16 ENCOUNTER — Encounter: Payer: Self-pay | Admitting: Family Medicine

## 2017-08-16 ENCOUNTER — Other Ambulatory Visit: Payer: Self-pay

## 2017-08-16 ENCOUNTER — Ambulatory Visit: Payer: Medicare Other | Admitting: Family Medicine

## 2017-08-16 VITALS — BP 138/68 | HR 78 | Temp 97.7°F | Resp 16 | Ht 62.0 in | Wt 116.0 lb

## 2017-08-16 DIAGNOSIS — I10 Essential (primary) hypertension: Secondary | ICD-10-CM

## 2017-08-16 DIAGNOSIS — E039 Hypothyroidism, unspecified: Secondary | ICD-10-CM

## 2017-08-16 DIAGNOSIS — E2839 Other primary ovarian failure: Secondary | ICD-10-CM | POA: Diagnosis not present

## 2017-08-16 DIAGNOSIS — Z0000 Encounter for general adult medical examination without abnormal findings: Secondary | ICD-10-CM | POA: Diagnosis not present

## 2017-08-16 MED ORDER — TRIAZOLAM 0.25 MG PO TABS: 0.1250 mg | ORAL_TABLET | Freq: Every evening | ORAL | 1 refills | Status: DC | PRN

## 2017-08-16 NOTE — Progress Notes (Signed)
Patient: Isabel Myers, Female    DOB: 1930/03/27, 84 y.o.   MRN: [id number] Visit Date: 08/16/2017  Today's Provider: Mila Merry, MD   Chief Complaint  Patient presents with  . Annual Exam   Subjective:     Complete Physical Isabel Myers is a 84 y.o. female. She feels well. She reports exercising by walking daily. She reports she is sleeping fairly well. Has some arthritis pains in her hips and knees, but otherwise feels well.   -----------------------------------------------------------   Hypertension, follow-up:  BP Readings from Last 3 Encounters:  08/16/17 (!) 150/70  05/31/17 138/64  09/01/16 130/60    She was last seen for hypertension 1 years ago.  BP at that visit was 130/60. Management since that visit includes; labs checked, no changes.She reports good compliance with treatment. She is not having side effects. none She is exercising. She is adherent to low salt diet.   Outside blood pressures are nont checking. She is experiencing none.  Patient denies none.   Cardiovascular risk factors include advanced age (older than 49 for men, 62 for women).  Use of agents associated with hypertension: none.   ----------------------------------------------------------------  Hypothyroidism, unspecified type From 09/01/2016-labs checked, no changes. Is taking levothyroxine and tolerating well.  Lab Results  Component Value Date   TSH 1.010 09/01/2016       Review of Systems  Constitutional: Negative for chills, diaphoresis and fever.  HENT: Negative for congestion, ear discharge, ear pain, hearing loss, nosebleeds, sore throat and tinnitus.   Eyes: Negative for photophobia, pain, discharge and redness.  Respiratory: Negative for cough, shortness of breath, wheezing and stridor.   Cardiovascular: Negative for chest pain, palpitations and leg swelling.  Gastrointestinal: Negative for abdominal pain, blood in stool, constipation, diarrhea, nausea  and vomiting.  Endocrine: Negative for polydipsia.  Genitourinary: Negative for dysuria, flank pain, frequency, hematuria and urgency.  Musculoskeletal: Positive for arthralgias. Negative for back pain, myalgias and neck pain.  Skin: Negative for rash.  Allergic/Immunologic: Negative for environmental allergies.  Neurological: Negative for dizziness, tremors, seizures, weakness and headaches.  Hematological: Does not bruise/bleed easily.  Psychiatric/Behavioral: Negative for hallucinations and suicidal ideas. The patient is not nervous/anxious.   All other systems reviewed and are negative.   Social History   Socioeconomic History  . Marital status: Married    Spouse name: Not on file  . Number of children: Not on file  . Years of education: Not on file  . Highest education level: Not on file  Social Needs  . Financial resource strain: Not on file  . Food insecurity - worry: Not on file  . Food insecurity - inability: Not on file  . Transportation needs - medical: Not on file  . Transportation needs - non-medical: Not on file  Occupational History  . Occupation: Retired  Tobacco Use  . Smoking status: Former Smoker    Packs/day: 0.50    Years: 15.00    Pack years: 7.50    Types: Cigarettes  . Smokeless tobacco: Never Used  . Tobacco comment: Quit in the 1970s  Substance and Sexual Activity  . Alcohol use: Yes    Alcohol/week: 4.2 oz    Types: 7 Glasses of wine per week  . Drug use: No  . Sexual activity: Not on file  Other Topics Concern  . Not on file  Social History Narrative  . Not on file    Past Medical History:  Diagnosis Date  .  Hypertension   . Thyroid disease    hypothyroid     Patient Active Problem List   Diagnosis Date Noted  . Hypokalemia 09/16/2015  . Hypothyroidism 09/16/2015  . Insomnia 09/16/2015  . Low back pain 09/16/2015  . Osteoarthritis 09/16/2015  . Spondylolisthesis of lumbar region 09/16/2015  . Degeneration of intervertebral  disc of lumbar region 01/01/2015  . Lumbar canal stenosis 01/01/2015  . Asymptomatic varicose veins of lower extremity 10/26/2008  . Essential (primary) hypertension 07/13/1998    Past Surgical History:  Procedure Laterality Date  . ABDOMINAL HYSTERECTOMY  1987   with BSO  . Bladder tack  2009   Dr. Chrissie Noaefranchesco  . CT Cervical Spine  10/22/2011   negative  . CT Scan of head  10/22/2011   without contrast, [facility], Normal  . THYROIDECTOMY  1937   SUBTOTAL  . TONSILLECTOMY AND ADENOIDECTOMY      Her family history includes Memory loss in her sister.      Current Outpatient Medications:  .  amLODipine (NORVASC) 2.5 MG tablet, TAKE 1 TABLET BY MOUTH DAILY, Disp: 30 tablet, Rfl: 11 .  Calcium Carbonate (CALCIUM 600 PO), Take 1 tablet by mouth daily., Disp: , Rfl:  .  hydrochlorothiazide (HYDRODIURIL) 25 MG tablet, TAKE 1 TABLET BY MOUTH DAILY, Disp: 30 tablet, Rfl: 12 .  levothyroxine (SYNTHROID, LEVOTHROID) 88 MCG tablet, Take 1 tablet (88 mcg total) by mouth daily., Disp: 30 tablet, Rfl: 11 .  triazolam (HALCION) 0.25 MG tablet, Take 0.5-1 tablets by mouth at bedtime as needed., Disp: , Rfl:   Patient Care Team: Malva LimesFisher, Naje Rice E, MD as PCP - General (Family Medicine) Dingeldein, Viviann SpareSteven, MD (Ophthalmology)     Objective:   Vitals: BP (!) 150/70 (BP Location: Right Arm, Patient Position: Sitting, Cuff Size: Normal)   Pulse 78   Temp 97.7 F (36.5 C) (Oral)   Resp 16   Ht 5\' 2"  (1.575 m)   Wt 116 lb (52.6 kg)   SpO2 98%   BMI 21.22 kg/m  Vitals:   08/16/17 1122 08/16/17 1143  BP: (!) 150/70 138/68  Pulse: 78   Resp: 16   Temp: 97.7 F (36.5 C)   TempSrc: Oral   SpO2: 98%   Weight: 116 lb (52.6 kg)   Height: 5\' 2"  (1.575 m)    Physical Exam   General Appearance:    Alert, cooperative, no distress, appears stated age  Head:    Normocephalic, without obvious abnormality, atraumatic  Eyes:    PERRL, conjunctiva/corneas clear, EOM's intact, fundi    benign, both  eyes  Ears:    Normal TM's and external ear canals, both ears  Nose:   Nares normal, septum midline, mucosa normal, no drainage    or sinus tenderness  Throat:   Lips, mucosa, and tongue normal; teeth and gums normal  Neck:   Supple, symmetrical, trachea midline, no adenopathy;    thyroid:  no enlargement/tenderness/nodules; no carotid   bruit or JVD  Back:     Symmetric, no curvature, ROM normal, no CVA tenderness  Lungs:     Clear to auscultation bilaterally, respirations unlabored  Chest Wall:    No tenderness or deformity   Heart:    Regular rate and rhythm, S1 and S2 normal, no murmur, rub   or gallop  Breast Exam:    deferred  Abdomen:     Soft, non-tender, bowel sounds active all four quadrants,    no masses, no organomegaly  Pelvic:  deferred  Extremities:   Extremities normal, atraumatic, no cyanosis or edema  Pulses:   2+ and symmetric all extremities  Skin:   Skin color, texture, turgor normal, no rashes or lesions  Lymph nodes:   Cervical, supraclavicular, and axillary nodes normal  Neurologic:   CNII-XII intact, normal strength, sensation and reflexes    throughout    Activities of Daily Living In your present state of health, do you have any difficulty performing the following activities: 05/31/2017  Hearing? N  Vision? N  Difficulty concentrating or making decisions? N  Walking or climbing stairs? Y  Comment due to arthritis pain  Dressing or bathing? N  Doing errands, shopping? N  Preparing Food and eating ? N  Using the Toilet? N  In the past six months, have you accidently leaked urine? N  Do you have problems with loss of bowel control? N  Managing your Medications? N  Managing your Finances? N  Housekeeping or managing your Housekeeping? N  Some recent data might be hidden    Fall Risk Assessment Fall Risk  05/31/2017 03/03/2016  Falls in the past year? No No     Depression Screen PHQ 2/9 Scores 05/31/2017 05/31/2017 03/03/2016  PHQ - 2 Score 0 0  0  PHQ- 9 Score 0 - -       Assessment & Plan:    Annual Physical Reviewed patient's Family Medical History Reviewed and updated list of patient's medical providers Assessment of cognitive impairment was done Assessed patient's functional ability Established a written schedule for health screening services Health Risk Assessent Completed and Reviewed  Exercise Activities and Dietary recommendations Goals    . DIET - WATER INTAKE     Continue drinking 6-8 glasses of water a day.        Immunization History  Administered Date(s) Administered  . DTaP 07/28/1993  . Influenza-Unspecified 04/12/2016  . Pneumococcal Conjugate-13 04/06/2014  . Pneumococcal Polysaccharide-23 07/28/2006  . Td 08/09/2007  . Tdap 11/30/2011  . Zoster 03/05/2006    Health Maintenance  Topic Date Due  . Janet Berlin  11/29/2021  . INFLUENZA VACCINE  Completed  . DEXA SCAN  Completed  . PNA vac Low Risk Adult  Completed     Discussed health benefits of physical activity, and encouraged her to engage in regular exercise appropriate for her age and condition.    --------------------------------------------------------------------------  1. Annual physical exam   2. Essential (primary) hypertension Well controlled.  Continue current medications.   - Renal function panel  3. Hypothyroidism, unspecified type  - TSH  4. Estrogen deficiency  - DG Bone Density; Future   Mila Merry, MD  [facility]

## 2017-08-16 NOTE — Patient Instructions (Addendum)
 The CDC recommends two doses of Shingrix (the shingles vaccine) separated by 2 to 6 months for adults age 82 years and older. I recommend checking with your insurance plan regarding coverage for this vaccine.     Preventive Care 65 Years and Older, Female Preventive care refers to lifestyle choices and visits with your health care provider that can promote health and wellness. What does preventive care include?  A yearly physical exam. This is also called an annual well check.  Dental exams once or twice a year.  Routine eye exams. Ask your health care provider how often you should have your eyes checked.  Personal lifestyle choices, including: ? Daily care of your teeth and gums. ? Regular physical activity. ? Eating a healthy diet. ? Avoiding tobacco and drug use. ? Limiting alcohol use. ? Practicing safe sex. ? Taking low-dose aspirin every day. ? Taking vitamin and mineral supplements as recommended by your health care provider. What happens during an annual well check? The services and screenings done by your health care provider during your annual well check will depend on your age, overall health, lifestyle risk factors, and family history of disease. Counseling Your health care provider may ask you questions about your:  Alcohol use.  Tobacco use.  Drug use.  Emotional well-being.  Home and relationship well-being.  Sexual activity.  Eating habits.  History of falls.  Memory and ability to understand (cognition).  Work and work environment.  Reproductive health.  Screening You may have the following tests or measurements:  Height, weight, and BMI.  Blood pressure.  Lipid and cholesterol levels. These may be checked every 5 years, or more frequently if you are over 82 years old.  Skin check.  Lung cancer screening. You may have this screening every year starting at age 55 if you have a 30-pack-year history of smoking and currently smoke or have  quit within the past 15 years.  Fecal occult blood test (FOBT) of the stool. You may have this test every year starting at age 82.  Flexible sigmoidoscopy or colonoscopy. You may have a sigmoidoscopy every 5 years or a colonoscopy every 10 years starting at age 82.  Hepatitis C blood test.  Hepatitis B blood test.  Sexually transmitted disease (STD) testing.  Diabetes screening. This is done by checking your blood sugar (glucose) after you have not eaten for a while (fasting). You may have this done every 1-3 years.  Bone density scan. This is done to screen for osteoporosis. You may have this done starting at age 65.  Mammogram. This may be done every 1-2 years. Talk to your health care provider about how often you should have regular mammograms.  Talk with your health care provider about your test results, treatment options, and if necessary, the need for more tests. Vaccines Your health care provider may recommend certain vaccines, such as:  Influenza vaccine. This is recommended every year.  Tetanus, diphtheria, and acellular pertussis (Tdap, Td) vaccine. You may need a Td booster every 10 years.  Varicella vaccine. You may need this if you have not been vaccinated.  Zoster vaccine. You may need this after age 60.  Measles, mumps, and rubella (MMR) vaccine. You may need at least one dose of MMR if you were born in 1957 or later. You may also need a second dose.  Pneumococcal 13-valent conjugate (PCV13) vaccine. One dose is recommended after age 65.  Pneumococcal polysaccharide (PPSV23) vaccine. One dose is recommended after age 65.    Meningococcal vaccine. You may need this if you have certain conditions.  Hepatitis A vaccine. You may need this if you have certain conditions or if you travel or work in places where you may be exposed to hepatitis A.  Hepatitis B vaccine. You may need this if you have certain conditions or if you travel or work in places where you may be  exposed to hepatitis B.  Haemophilus influenzae type b (Hib) vaccine. You may need this if you have certain conditions.  Talk to your health care provider about which screenings and vaccines you need and how often you need them. This information is not intended to replace advice given to you by your health care provider. Make sure you discuss any questions you have with your health care provider. Document Released: 07/26/2015 Document Revised: 03/18/2016 Document Reviewed: 04/30/2015 Elsevier Interactive Patient Education  2018 [employer]  

## 2017-08-17 LAB — RENAL FUNCTION PANEL
Albumin: 4.7 g/dL (ref 3.5–4.7)
BUN / CREAT RATIO: 21 (ref 12–28)
BUN: 14 mg/dL (ref 8–27)
CALCIUM: 9.6 mg/dL (ref 8.7–10.3)
CHLORIDE: 93 mmol/L — AB (ref 96–106)
CO2: 24 mmol/L (ref 20–29)
Creatinine, Ser: 0.66 mg/dL (ref 0.57–1.00)
GFR calc Af Amer: 93 mL/min/{1.73_m2} (ref >59)
GFR calc non Af Amer: 81 mL/min/{1.73_m2} (ref >59)
Glucose: 79 mg/dL (ref 65–99)
Phosphorus: 3.1 mg/dL (ref 2.5–4.5)
Potassium: 3.6 mmol/L (ref 3.5–5.2)
SODIUM: 137 mmol/L (ref 134–144)

## 2017-08-17 LAB — TSH: TSH: 0.811 u[IU]/mL (ref 0.450–4.500)

## 2017-09-14 ENCOUNTER — Ambulatory Visit
Admission: RE | Admit: 2017-09-14 | Discharge: 2017-09-14 | Disposition: A | Discharge status: Home / Self Care | Payer: Medicare Other | Source: Ambulatory Visit | Attending: Family Medicine | Admitting: Family Medicine

## 2017-09-14 DIAGNOSIS — M85832 Other specified disorders of bone density and structure, left forearm: Secondary | ICD-10-CM | POA: Insufficient documentation

## 2017-09-14 DIAGNOSIS — E2839 Other primary ovarian failure: Secondary | ICD-10-CM | POA: Insufficient documentation

## 2018-04-02 ENCOUNTER — Other Ambulatory Visit: Payer: Self-pay | Admitting: Family Medicine

## 2018-05-21 ENCOUNTER — Other Ambulatory Visit: Payer: Self-pay | Admitting: Family Medicine

## 2018-06-02 ENCOUNTER — Ambulatory Visit (INDEPENDENT_AMBULATORY_CARE_PROVIDER_SITE_OTHER): Payer: Medicare Other

## 2018-06-02 VITALS — BP 154/48 | HR 66 | Temp 97.8°F | Ht 62.0 in | Wt 117.4 lb

## 2018-06-02 DIAGNOSIS — Z0000 Encounter for general adult medical examination without abnormal findings: Secondary | ICD-10-CM | POA: Diagnosis not present

## 2018-06-02 NOTE — Patient Instructions (Addendum)
Isabel Myers , Thank you for taking time to come for your Medicare Wellness Visit. I appreciate your ongoing commitment to your health goals. Please review the following plan we discussed and let me know if I can assist you in the future.   Screening recommendations/referrals: Colonoscopy: No longer required.  Mammogram: No longer required.  Bone Density: Up to date, due 09/2022 Recommended yearly ophthalmology/optometry visit for glaucoma screening and checkup Recommended yearly dental visit for hygiene and checkup  Vaccinations: Influenza vaccine: Up to date Pneumococcal vaccine: Completed series Tdap vaccine: Up to date, due 11/2021 Shingles vaccine:.Pt declines today.    Advanced directives: Please bring a copy of your POA (Power of Attorney) and/or Living Will to your next appointment.   Conditions/risks identified: Continue drinking 6-8 glasses or water a day.  Next appointment: 08/15/18 @ 9:00 AM with Dr Sherrie MustacheFisher. Declined scheduling an AWV for 2020 at this time.    Preventive Care 565 Years and Older, Female Preventive care refers to lifestyle choices and visits with your health care provider that can promote health and wellness. What does preventive care include?  A yearly physical exam. This is also called an annual well check.  Dental exams once or twice a year.  Routine eye exams. Ask your health care provider how often you should have your eyes checked.  Personal lifestyle choices, including:  Daily care of your teeth and gums.  Regular physical activity.  Eating a healthy diet.  Avoiding tobacco and drug use.  Limiting alcohol use.  Practicing safe sex.  Taking low-dose aspirin every day.  Taking vitamin and mineral supplements as recommended by your health care provider. What happens during an annual well check? The services and screenings done by your health care provider during your annual well check will depend on your age, overall health, lifestyle  risk factors, and family history of disease. Counseling  Your health care provider may ask you questions about your:  Alcohol use.  Tobacco use.  Drug use.  Emotional well-being.  Home and relationship well-being.  Sexual activity.  Eating habits.  History of falls.  Memory and ability to understand (cognition).  Work and work Astronomerenvironment.  Reproductive health. Screening  You may have the following tests or measurements:  Height, weight, and BMI.  Blood pressure.  Lipid and cholesterol levels. These may be checked every 5 years, or more frequently if you are over 82 years old.  Skin check.  Lung cancer screening. You may have this screening every year starting at age 82 if you have a 30-pack-year history of smoking and currently smoke or have quit within the past 15 years.  Fecal occult blood test (FOBT) of the stool. You may have this test every year starting at age 82.  Flexible sigmoidoscopy or colonoscopy. You may have a sigmoidoscopy every 5 years or a colonoscopy every 10 years starting at age 82.  Hepatitis C blood test.  Hepatitis B blood test.  Sexually transmitted disease (STD) testing.  Diabetes screening. This is done by checking your blood sugar (glucose) after you have not eaten for a while (fasting). You may have this done every 1-3 years.  Bone density scan. This is done to screen for osteoporosis. You may have this done starting at age 82.  Mammogram. This may be done every 1-2 years. Talk to your health care provider about how often you should have regular mammograms. Talk with your health care provider about your test results, treatment options, and if necessary, the need  for more tests. Vaccines  Your health care provider may recommend certain vaccines, such as:  Influenza vaccine. This is recommended every year.  Tetanus, diphtheria, and acellular pertussis (Tdap, Td) vaccine. You may need a Td booster every 10 years.  Zoster vaccine.  You may need this after age 37.  Pneumococcal 13-valent conjugate (PCV13) vaccine. One dose is recommended after age 76.  Pneumococcal polysaccharide (PPSV23) vaccine. One dose is recommended after age 55. Talk to your health care provider about which screenings and vaccines you need and how often you need them. This information is not intended to replace advice given to you by your health care provider. Make sure you discuss any questions you have with your health care provider. Document Released: 07/26/2015 Document Revised: 03/18/2016 Document Reviewed: 04/30/2015 Elsevier Interactive Patient Education  2017 [place] Prevention in the Home Falls can cause injuries. They can happen to people of all ages. There are many things you can do to make your home safe and to help prevent falls. What can I do on the outside of my home?  Regularly fix the edges of walkways and driveways and fix any cracks.  Remove anything that might make you trip as you walk through a door, such as a raised step or threshold.  Trim any bushes or trees on the path to your home.  Use bright outdoor lighting.  Clear any walking paths of anything that might make someone trip, such as rocks or tools.  Regularly check to see if handrails are loose or broken. Make sure that both sides of any steps have handrails.  Any raised decks and porches should have guardrails on the edges.  Have any leaves, snow, or ice cleared regularly.  Use sand or salt on walking paths during winter.  Clean up any spills in your garage right away. This includes oil or grease spills. What can I do in the bathroom?  Use night lights.  Install grab bars by the toilet and in the tub and shower. Do not use towel bars as grab bars.  Use non-skid mats or decals in the tub or shower.  If you need to sit down in the shower, use a plastic, non-slip stool.  Keep the floor dry. Clean up any water that spills on the floor as soon  as it happens.  Remove soap buildup in the tub or shower regularly.  Attach bath mats securely with double-sided non-slip rug tape.  Do not have throw rugs and other things on the floor that can make you trip. What can I do in the bedroom?  Use night lights.  Make sure that you have a light by your bed that is easy to reach.  Do not use any sheets or blankets that are too big for your bed. They should not hang down onto the floor.  Have a firm chair that has side arms. You can use this for support while you get dressed.  Do not have throw rugs and other things on the floor that can make you trip. What can I do in the kitchen?  Clean up any spills right away.  Avoid walking on wet floors.  Keep items that you use a lot in easy-to-reach places.  If you need to reach something above you, use a strong step stool that has a grab bar.  Keep electrical cords out of the way.  Do not use floor polish or wax that makes floors slippery. If you must use wax, use  non-skid floor wax.  Do not have throw rugs and other things on the floor that can make you trip. What can I do with my stairs?  Do not leave any items on the stairs.  Make sure that there are handrails on both sides of the stairs and use them. Fix handrails that are broken or loose. Make sure that handrails are as long as the stairways.  Check any carpeting to make sure that it is firmly attached to the stairs. Fix any carpet that is loose or worn.  Avoid having throw rugs at the top or bottom of the stairs. If you do have throw rugs, attach them to the floor with carpet tape.  Make sure that you have a light switch at the top of the stairs and the bottom of the stairs. If you do not have them, ask someone to add them for you. What else can I do to help prevent falls?  Wear shoes that:  Do not have high heels.  Have rubber bottoms.  Are comfortable and fit you well.  Are closed at the toe. Do not wear sandals.  If  you use a stepladder:  Make sure that it is fully opened. Do not climb a closed stepladder.  Make sure that both sides of the stepladder are locked into place.  Ask someone to hold it for you, if possible.  Clearly mark and make sure that you can see:  Any grab bars or handrails.  First and last steps.  Where the edge of each step is.  Use tools that help you move around (mobility aids) if they are needed. These include:  Canes.  Walkers.  Scooters.  Crutches.  Turn on the lights when you go into a dark area. Replace any light bulbs as soon as they burn out.  Set up your furniture so you have a clear path. Avoid moving your furniture around.  If any of your floors are uneven, fix them.  If there are any pets around you, be aware of where they are.  Review your medicines with your doctor. Some medicines can make you feel dizzy. This can increase your chance of falling. Ask your doctor what other things that you can do to help prevent falls. This information is not intended to replace advice given to you by your health care provider. Make sure you discuss any questions you have with your health care provider. Document Released: 04/25/2009 Document Revised: 12/05/2015 Document Reviewed: 08/03/2014 Elsevier Interactive Patient Education  2017 [employer].

## 2018-06-02 NOTE — Progress Notes (Signed)
Subjective:   Isabel Myers is a 84 y.o. female who presents for Medicare Annual (Subsequent) preventive examination.  Review of Systems:  N/A  Cardiac Risk Factors include: advanced age (>6455men, 53>65 women);hypertension     Objective:     Vitals: BP (!) 154/48 (BP Location: Right Arm)   Pulse 66   Temp 97.8 F (36.6 C) (Oral)   Ht 5\' 2"  (1.575 m)   Wt 117 lb 6.4 oz (53.3 kg)   BMI 21.47 kg/m   Body mass index is 21.47 kg/m.  Advanced Directives 06/02/2018 05/31/2017  Does Patient Have a Medical Advance Directive? Yes Yes  Type of Advance Directive Living will;Healthcare Power of [employer] Healthcare Power of [place];Living will  Copy of Healthcare Power of Attorney in Chart? No - copy requested No - copy requested    Tobacco Social History   Tobacco Use  Smoking Status Former Smoker  . Packs/day: 0.50  . Years: 15.00  . Pack years: 7.50  . Types: Cigarettes  Smokeless Tobacco Never Used  Tobacco Comment   Quit in the 1970s     Counseling given: Not Answered Comment: Quit in the 1970s   Clinical Intake:  Pre-visit preparation completed: Yes  Pain : No/denies pain Pain Score: 0-No pain(Has chronic neck pain when bending or is up moving. )     Nutritional Status: BMI of 19-24  Normal Nutritional Risks: None Diabetes: No  How often do you need to have someone help you when you read instructions, pamphlets, or other written materials from your doctor or pharmacy?: 1 - Never  Interpreter Needed?: No  Information entered by :: [facility] Center[facility]markoski, LPN  Past Medical History:  Diagnosis Date  . Hypertension   . Thyroid disease    hypothyroid   Past Surgical History:  Procedure Laterality Date  . ABDOMINAL HYSTERECTOMY  1987   with BSO  . Bladder tack  2009   Dr. Chrissie Noaefranchesco  . CT Cervical Spine  10/22/2011   negative  . CT Scan of head  10/22/2011   without contrast, [facility], Normal  . THYROIDECTOMY  1937   SUBTOTAL  . TONSILLECTOMY AND  ADENOIDECTOMY     Family History  Problem Relation Age of Onset  . Memory loss Sister        short term memory loss   Social History   Socioeconomic History  . Marital status: Married    Spouse name: Not on file  . Number of children: 2  . Years of education: Not on file  . Highest education level: Master's degree (e.g., MA, MS, MEng, MEd, MSW, MBA)  Occupational History  . Occupation: Retired  Engineer, productionocial Needs  . Financial resource strain: Not hard at all  . Food insecurity:    Worry: Never true    Inability: Never true  . Transportation needs:    Medical: No    Non-medical: No  Tobacco Use  . Smoking status: Former Smoker    Packs/day: 0.50    Years: 15.00    Pack years: 7.50    Types: Cigarettes  . Smokeless tobacco: Never Used  . Tobacco comment: Quit in the 1970s  Substance and Sexual Activity  . Alcohol use: Yes    Alcohol/week: 7.0 standard drinks    Types: 7 Glasses of wine per week  . Drug use: No  . Sexual activity: Not on file  Lifestyle  . Physical activity:    Days per week: 0 days    Minutes per session: 0 min  .  Stress: Not on file  Relationships  . Social connections:    Talks on phone: Patient refused    Gets together: Patient refused    Attends religious service: Patient refused    Active member of club or organization: Patient refused    Attends meetings of clubs or organizations: Patient refused    Relationship status: Patient refused  Other Topics Concern  . Not on file  Social History Narrative  . Not on file    Outpatient Encounter Medications as of 06/02/2018  Medication Sig  . amLODipine (NORVASC) 2.5 MG tablet TAKE 1 TABLET BY MOUTH DAILY  . Calcium Carbonate (CALCIUM 600 PO) Take 1 tablet by mouth daily.  . hydrochlorothiazide (HYDRODIURIL) 25 MG tablet TAKE ONE TABLET BY MOUTH DAILY  . levothyroxine (SYNTHROID, LEVOTHROID) 88 MCG tablet Take 1 tablet (88 mcg total) by mouth daily.  . triazolam (HALCION) 0.25 MG tablet Take 0.5-1  tablets (0.125-0.25 mg total) by mouth at bedtime as needed.   No facility-administered encounter medications on file as of 06/02/2018.     Activities of Daily Living In your present state of health, do you have any difficulty performing the following activities: 06/02/2018  Hearing? N  Vision? N  Difficulty concentrating or making decisions? N  Walking or climbing stairs? N  Dressing or bathing? N  Doing errands, shopping? N  Preparing Food and eating ? N  Using the Toilet? N  In the past six months, have you accidently leaked urine? N  Do you have problems with loss of bowel control? N  Managing your Medications? N  Managing your Finances? N  Housekeeping or managing your Housekeeping? N  Some recent data might be hidden    Patient Care Team: [name], MD as PCP - General (Family Medicine) Dingeldein, Viviann Spare, MD (Ophthalmology)    Assessment:   This is a routine wellness examination for Isabel Myers.  Exercise Activities and Dietary recommendations Current Exercise Habits: The patient does not participate in regular exercise at present, Exercise limited by: orthopedic condition(s)  Goals    . DIET - WATER INTAKE     Continue drinking 6-8 glasses of water a day.        Fall Risk Fall Risk  06/02/2018 05/31/2017 03/03/2016  Falls in the past year? 0 No No   FALL RISK PREVENTION PERTAINING TO THE HOME:  Any stairs in or around the home WITH handrails? No  Home free of loose throw rugs in walkways, pet beds, electrical cords, etc? Yes  Adequate lighting in your home to reduce risk of falls? Yes   ASSISTIVE DEVICES UTILIZED TO PREVENT FALLS:  Life alert? Yes Use of a cane, walker or w/c? Yes , uses a cane. Grab bars in the bathroom? Yes  Shower chair or bench in shower? No  Elevated toilet seat or a handicapped toilet? No    TIMED UP AND GO:  Was the test performed? No .    Depression Screen PHQ 2/9 Scores 06/02/2018 06/02/2018 05/31/2017 05/31/2017    PHQ - 2 Score 0 0 0 0  PHQ- 9 Score 0 - 0 -     Cognitive Function     6CIT Screen 06/02/2018 05/31/2017  What Year? 0 points 0 points  What month? 0 points -  What time? 0 points 0 points  Count back from 20 0 points 0 points  Months in reverse 0 points 0 points  Repeat phrase 0 points 0 points  Total Score 0 -  Immunization History  Administered Date(s) Administered  . DTaP 07/28/1993  . Influenza-Unspecified 04/12/2016  . Pneumococcal Conjugate-13 04/06/2014  . Pneumococcal Polysaccharide-23 07/28/2006  . Td 08/09/2007  . Tdap 11/30/2011  . Zoster 03/05/2006    Qualifies for Shingles Vaccine? Yes  Zostavax completed 03/05/06. Due for Shingrix. Education has been provided regarding the importance of this vaccine. Pt has been advised to call insurance company to determine out of pocket expense. Advised may also receive vaccine at local pharmacy or Health Dept. Verbalized acceptance and understanding.  Tdap: Up to date  Flu Vaccine: Up to date  Pneumococcal Vaccine: Up to date   Screening Tests Health Maintenance  Topic Date Due  . TETANUS/TDAP  11/29/2021  . DEXA SCAN  09/15/2022  . INFLUENZA VACCINE  Completed  . PNA vac Low Risk Adult  Completed   Cancer Screenings:  Colorectal Screening: No longer required.   Mammogram: No longer required.   Bone Density: Completed 09/14/17. Results reflect OSTEOPENIA. Repeat every 5 years.   Lung Cancer Screening: (Low Dose CT Chest recommended if Age 1-80 years, 30 pack-year currently smoking OR have quit w/in 15years.) does not qualify.    Additional Screening:  Vision Screening: Recommended annual ophthalmology exams for early detection of glaucoma and other disorders of the eye.  Dental Screening: Recommended annual dental exams for proper oral hygiene  Community Resource Referral:  CRR required this visit?  No       Plan:  I have personally reviewed and addressed the Medicare Annual Wellness  questionnaire and have noted the following in the patient's chart:  A. Medical and social history B. Use of alcohol, tobacco or illicit drugs  C. Current medications and supplements D. Functional ability and status E.  Nutritional status F.  Physical activity G. Advance directives H. List of other physicians I.  Hospitalizations, surgeries, and ER visits in previous 12 months J.  Vitals K. Screenings such as hearing and vision if needed, cognitive and depression L. Referrals and appointments - none  In addition, I have reviewed and discussed with patient certain preventive protocols, quality metrics, and best practice recommendations. A written personalized care plan for preventive services as well as general preventive health recommendations were provided to patient.  See attached scanned questionnaire for additional information.   Signed,  Hyacinth Meeker, LPN Nurse Health Advisor   Nurse Recommendations: None.

## 2018-08-15 ENCOUNTER — Encounter: Payer: Medicare Other | Admitting: Family Medicine

## 2018-08-15 ENCOUNTER — Other Ambulatory Visit: Payer: Self-pay | Admitting: Family Medicine

## 2018-08-15 MED ORDER — TRIAZOLAM 0.25 MG PO TABS: 0.1250 mg | ORAL_TABLET | Freq: Every evening | ORAL | 3 refills | Status: DC | PRN

## 2018-08-15 MED ORDER — AMLODIPINE BESYLATE 2.5 MG PO TABS: 2.5000 mg | ORAL_TABLET | Freq: Every day | ORAL | 11 refills | Status: DC

## 2018-08-15 MED ORDER — LEVOTHYROXINE SODIUM 88 MCG PO TABS: 88.0000 ug | ORAL_TABLET | Freq: Every day | ORAL | 11 refills | Status: DC

## 2018-08-15 MED ORDER — HYDROCHLOROTHIAZIDE 25 MG PO TABS
25.0000 mg | ORAL_TABLET | Freq: Every day | ORAL | 12 refills | Status: DC
Start: 2018-08-15 — End: 2019-09-12

## 2018-08-15 NOTE — Telephone Encounter (Signed)
Pt needing refills:  levothyroxine (SYNTHROID, LEVOTHROID) 88 MCG tablet amLODipine (NORVASC) 2.5 MG tablet hydrochlorothiazide (HYDRODIURIL) 25 MG tablet triazolam (HALCION) 0.25 MG tablet   Please fill at:   [facility] - [place], [place] - [address] [phone] (Phone) [phone] (Fax)

## 2018-09-02 ENCOUNTER — Ambulatory Visit (INDEPENDENT_AMBULATORY_CARE_PROVIDER_SITE_OTHER): Payer: Medicare Other | Admitting: Family Medicine

## 2018-09-02 ENCOUNTER — Encounter: Payer: Self-pay | Admitting: Family Medicine

## 2018-09-02 VITALS — BP 164/68 | HR 71 | Temp 98.1°F | Resp 16 | Ht 61.0 in | Wt 114.0 lb

## 2018-09-02 DIAGNOSIS — I10 Essential (primary) hypertension: Secondary | ICD-10-CM | POA: Diagnosis not present

## 2018-09-02 DIAGNOSIS — E039 Hypothyroidism, unspecified: Secondary | ICD-10-CM | POA: Diagnosis not present

## 2018-09-02 DIAGNOSIS — E876 Hypokalemia: Secondary | ICD-10-CM | POA: Diagnosis not present

## 2018-09-02 DIAGNOSIS — Z0000 Encounter for general adult medical examination without abnormal findings: Secondary | ICD-10-CM | POA: Diagnosis not present

## 2018-09-02 DIAGNOSIS — G4700 Insomnia, unspecified: Secondary | ICD-10-CM

## 2018-09-02 MED ORDER — AMLODIPINE BESYLATE 2.5 MG PO TABS: 2.5000 mg | ORAL_TABLET | Freq: Every day | ORAL | 11 refills | Status: DC

## 2018-09-02 NOTE — Progress Notes (Signed)
Patient: Isabel Myers, Female    DOB: Mar 10, 1930, 85 y.o.   MRN: [id number] Visit Date: 09/02/2018  Today's Provider: Mila Merry, MD   Chief Complaint  Patient presents with  . Annual Exam  . Hypertension  . Hypothyroidism   Subjective:     Complete Physical Isabel Myers is a 85 y.o. female. She feels fairly well. She reports exercising daily. She reports she is sleeping fairly well.  -----------------------------------------------------------   Hypertension, follow-up:  BP Readings from Last 3 Encounters:  09/02/18 (!) 164/68  06/02/18 (!) 154/48  08/16/17 138/68    She was last seen for hypertension 1 years ago.  BP at that visit was 138/68. Management since that visit includes no changes. She reports good compliance with treatment. She is not having side effects.  She is exercising. She is not adherent to low salt diet.   Outside blood pressures are not being checked. She is experiencing none.  Patient denies chest pain, chest pressure/discomfort, claudication, dyspnea, exertional chest pressure/discomfort, fatigue, irregular heart beat, lower extremity edema, near-syncope, orthopnea, palpitations, paroxysmal nocturnal dyspnea, syncope and tachypnea.   Cardiovascular risk factors include advanced age (older than 62 for men, 77 for women) and hypertension.  Use of agents associated with hypertension: thyroid hormones.     Weight trend: fluctuating a bit Wt Readings from Last 3 Encounters:  09/02/18 114 lb (51.7 kg)  06/02/18 117 lb 6.4 oz (53.3 kg)  08/16/17 116 lb (52.6 kg)    Current diet: well balanced  ------------------------------------------------------------------------  Follow up for Hypothyroidism:  The patient was last seen for this 1 years ago. Changes made at last visit include none.  She reports good compliance with treatment. She feels that condition is stable. She is not having side effects.    ------------------------------------------------------------------------------------  Follow up for Estrogen Deficiency:  The patient was last seen for this 1 years ago. Changes made at last visit include none.  She reports good compliance with treatment. She feels that condition is stable. She is not having side effects.   ------------------------------------------------------------------------------------  Review of Systems  Constitutional: Negative for chills, fatigue and fever.  HENT: Negative for congestion, ear pain, rhinorrhea, sneezing and sore throat.   Eyes: Negative.  Negative for pain and redness.  Respiratory: Negative for cough, shortness of breath and wheezing.   Cardiovascular: Negative for chest pain and leg swelling.  Gastrointestinal: Negative for abdominal pain, blood in stool, constipation, diarrhea and nausea.  Endocrine: Negative for polydipsia and polyphagia.  Genitourinary: Negative.  Negative for dysuria, flank pain, hematuria, pelvic pain, vaginal bleeding and vaginal discharge.  Musculoskeletal: Positive for arthralgias, back pain and neck pain. Negative for gait problem and joint swelling.  Skin: Negative for rash.  Neurological: Negative.  Negative for dizziness, tremors, seizures, weakness, light-headedness, numbness and headaches.  Hematological: Negative for adenopathy.  Psychiatric/Behavioral: Negative.  Negative for behavioral problems, confusion and dysphoric mood. The patient is not nervous/anxious and is not hyperactive.     Social History   Socioeconomic History  . Marital status: Married    Spouse name: Not on file  . Number of children: 2  . Years of education: Not on file  . Highest education level: Master's degree (e.g., MA, MS, MEng, MEd, MSW, MBA)  Occupational History  . Occupation: Retired  Engineer, production  . Financial resource strain: Not hard at all  . Food insecurity:    Worry: Never true    Inability: Never true  .  Transportation needs:    Medical: No    Non-medical: No  Tobacco Use  . Smoking status: Former Smoker    Packs/day: 0.50    Years: 15.00    Pack years: 7.50    Types: Cigarettes  . Smokeless tobacco: Never Used  . Tobacco comment: Quit in the 1970s  Substance and Sexual Activity  . Alcohol use: Yes    Alcohol/week: 7.0 standard drinks    Types: 7 Glasses of wine per week  . Drug use: No  . Sexual activity: Not on file  Lifestyle  . Physical activity:    Days per week: 0 days    Minutes per session: 0 min  . Stress: Not on file  Relationships  . Social connections:    Talks on phone: Patient refused    Gets together: Patient refused    Attends religious service: Patient refused    Active member of club or organization: Patient refused    Attends meetings of clubs or organizations: Patient refused    Relationship status: Patient refused  . Intimate partner violence:    Fear of current or ex partner: Patient refused    Emotionally abused: Patient refused    Physically abused: Patient refused    Forced sexual activity: Patient refused  Other Topics Concern  . Not on file  Social History Narrative  . Not on file    Past Medical History:  Diagnosis Date  . Hypertension   . Thyroid disease    hypothyroid     Patient Active Problem List   Diagnosis Date Noted  . Hypokalemia 09/16/2015  . Hypothyroidism 09/16/2015  . Insomnia 09/16/2015  . Low back pain 09/16/2015  . Osteoarthritis 09/16/2015  . Spondylolisthesis of lumbar region 09/16/2015  . Degeneration of intervertebral disc of lumbar region 01/01/2015  . Lumbar canal stenosis 01/01/2015  . Asymptomatic varicose veins of lower extremity 10/26/2008  . Essential (primary) hypertension 07/13/1998    Past Surgical History:  Procedure Laterality Date  . ABDOMINAL HYSTERECTOMY  1987   with BSO  . Bladder tack  2009   Dr. Chrissie Noaefranchesco  . CT Cervical Spine  10/22/2011   negative  . CT Scan of head  10/22/2011     without contrast, [facility], Normal  . THYROIDECTOMY  1937   SUBTOTAL  . TONSILLECTOMY AND ADENOIDECTOMY      Her family history includes Memory loss in her sister.   Current Outpatient Medications:  .  amLODipine (NORVASC) 2.5 MG tablet, Take 1 tablet (2.5 mg total) by mouth daily., Disp: 30 tablet, Rfl: 11 .  Calcium Carbonate (CALCIUM 600 PO), Take 1 tablet by mouth daily., Disp: , Rfl:  .  hydrochlorothiazide (HYDRODIURIL) 25 MG tablet, Take 1 tablet (25 mg total) by mouth daily., Disp: 30 tablet, Rfl: 12 .  levothyroxine (SYNTHROID, LEVOTHROID) 88 MCG tablet, Take 1 tablet (88 mcg total) by mouth daily., Disp: 30 tablet, Rfl: 11 .  triazolam (HALCION) 0.25 MG tablet, Take 0.5-1 tablets (0.125-0.25 mg total) by mouth at bedtime as needed., Disp: 30 tablet, Rfl: 3  Patient Care Team: Malva LimesFisher, Genise Strack E, MD as PCP - General (Family Medicine) Dingeldein, Viviann SpareSteven, MD (Ophthalmology)     Objective:    Vitals: BP (!) 164/68 (BP Location: Right Arm, Cuff Size: Normal)   Pulse 71   Temp 98.1 F (36.7 C) (Oral)   Resp 16   Ht 5\' 1"  (1.549 m)   Wt 114 lb (51.7 kg)   SpO2 97% Comment: room air  BMI 21.54 kg/m   Physical Exam  Activities of Daily Living In your present state of health, do you have any difficulty performing the following activities: 06/02/2018  Hearing? N  Vision? N  Difficulty concentrating or making decisions? N  Walking or climbing stairs? N  Dressing or bathing? N  Doing errands, shopping? N  Preparing Food and eating ? N  Using the Toilet? N  In the past six months, have you accidently leaked urine? N  Do you have problems with loss of bowel control? N  Managing your Medications? N  Managing your Finances? N  Housekeeping or managing your Housekeeping? N  Some recent data might be hidden    Fall Risk Assessment Fall Risk  06/02/2018 05/31/2017 03/03/2016  Falls in the past year? 0 No No     Depression Screen PHQ 2/9 Scores 06/02/2018 06/02/2018  05/31/2017 05/31/2017  PHQ - 2 Score 0 0 0 0  PHQ- 9 Score 0 - 0 -    6CIT Screen 06/02/2018  What Year? 0 points  What month? 0 points  What time? 0 points  Count back from 20 0 points  Months in reverse 0 points  Repeat phrase 0 points  Total Score 0       Assessment & Plan:    Annual Physical Reviewed patient's Family Medical History Reviewed and updated list of patient's medical providers Assessment of cognitive impairment was done Assessed patient's functional ability Established a written schedule for health screening services Health Risk Assessent Completed and Reviewed  Exercise Activities and Dietary recommendations Goals    . DIET - WATER INTAKE     Continue drinking 6-8 glasses of water a day.        Immunization History  Administered Date(s) Administered  . DTaP 07/28/1993  . Influenza-Unspecified 04/12/2016, 04/23/2018  . Pneumococcal Conjugate-13 04/06/2014  . Pneumococcal Polysaccharide-23 07/28/2006  . Td 08/09/2007  . Tdap 11/30/2011  . Zoster 03/05/2006    Health Maintenance  Topic Date Due  . Janet Berlin  11/29/2021  . DEXA SCAN  09/15/2022  . INFLUENZA VACCINE  Completed  . PNA vac Low Risk Adult  Completed     Discussed health benefits of physical activity, and encouraged her to engage in regular exercise appropriate for her age and condition.    ------------------------------------------------------------------------------------------------------------   2. Essential (primary) hypertension SBP elevated today and loast o.v. She blames a lot of legal stress that she has been dealing with, but expects this to improve. Discussed increasing amlodipine to 5mg , but she want to hold off since she had trouble with swelling when taking 5mg  dose in the past.  - Renal function panel - amLODipine (NORVASC) 2.5 MG tablet; Take 1 tablet (2.5 mg total) by mouth daily.  Dispense: 30 tablet; Refill: 11  Return in 3 months to check BP. Advised will  need to increase amlodipine if not better.   3. Hypothyroidism, unspecified type  - TSH  4. Hypokalemia  - Renal function panel  5. Insomnia, unspecified type Doing well taking occasional triazolam.     Mila Merry, MD  [facility]

## 2018-09-02 NOTE — Patient Instructions (Signed)
.   Please review the attached list of medications and notify my office if there are any errors.   . Please bring all of your medications to every appointment so we can make sure that our medication list is the same as yours.   

## 2018-09-06 ENCOUNTER — Telehealth: Payer: Self-pay

## 2018-09-06 LAB — RENAL FUNCTION PANEL
ALBUMIN: 4.4 g/dL (ref 3.6–4.6)
BUN / CREAT RATIO: 17 (ref 12–28)
BUN: 12 mg/dL (ref 8–27)
CHLORIDE: 93 mmol/L — AB (ref 96–106)
CO2: 25 mmol/L (ref 20–29)
Calcium: 9.6 mg/dL (ref 8.7–10.3)
Creatinine, Ser: 0.7 mg/dL (ref 0.57–1.00)
GFR, EST AFRICAN AMERICAN: 91 mL/min/{1.73_m2} (ref >59)
GFR, EST NON AFRICAN AMERICAN: 79 mL/min/{1.73_m2} (ref >59)
GLUCOSE: 77 mg/dL (ref 65–99)
POTASSIUM: 3.8 mmol/L (ref 3.5–5.2)
Phosphorus: 3.1 mg/dL (ref 3.0–4.3)
Sodium: 136 mmol/L (ref 134–144)

## 2018-09-06 LAB — TSH: TSH: 1.01 u[IU]/mL (ref 0.450–4.500)

## 2018-09-06 NOTE — Telephone Encounter (Signed)
Pt states to call her back in about an hour or so.

## 2018-09-06 NOTE — Telephone Encounter (Signed)
-----   Message from [name], MD sent at 09/06/2018  7:35 AM EST ----- Thyroid function is normal. Continue current dose of levothyroxine normal kidney functions and electrolytes. Continue current medications.  Check labs yearly

## 2018-09-06 NOTE — Telephone Encounter (Signed)
Tried calling; no answer.   Thanks,   -Laura  

## 2018-09-06 NOTE — Telephone Encounter (Signed)
Patient advised of results and verbally voiced understanding.  

## 2018-12-02 ENCOUNTER — Ambulatory Visit: Payer: Medicare Other | Admitting: Family Medicine

## 2018-12-02 ENCOUNTER — Encounter: Payer: Self-pay | Admitting: Family Medicine

## 2018-12-02 ENCOUNTER — Other Ambulatory Visit: Payer: Self-pay

## 2018-12-02 VITALS — BP 120/72 | HR 67 | Temp 98.4°F | Resp 16 | Wt 113.0 lb

## 2018-12-02 DIAGNOSIS — I10 Essential (primary) hypertension: Secondary | ICD-10-CM

## 2018-12-02 NOTE — Patient Instructions (Signed)
.   Please review the attached list of medications and notify my office if there are any errors.   . Please bring all of your medications to every appointment so we can make sure that our medication list is the same as yours.   

## 2018-12-02 NOTE — Progress Notes (Signed)
Patient: Isabel Myers Female    DOB: [birth year]   85 y.o.   MRN: [id number] Visit Date: 12/02/2018  Today's Provider: Mila Merry, MD   No chief complaint on file.  Subjective:     HPI  Hypertension, follow-up:  BP Readings from Last 3 Encounters:  12/02/18 120/72  09/02/18 (!) 164/68  06/02/18 (!) 154/48    She was last seen for hypertension 3 months ago.  BP at that visit was 164/68. Management since that visit includes no changes in meds, just improve diet and exercise more.  She reports good compliance with treatment. She is not having side effects.  She is exercising. She is adherent to low salt diet.   Outside blood pressures are not being checked. She is experiencing none.  Patient denies chest pain, chest pressure/discomfort, claudication, dyspnea, exertional chest pressure/discomfort, fatigue, irregular heart beat, lower extremity edema, near-syncope, orthopnea, palpitations, paroxysmal nocturnal dyspnea, syncope and tachypnea.   Cardiovascular risk factors include advanced age (older than 63 for men, 89 for women) and hypertension.  Use of agents associated with hypertension: thyroid hormones.     Weight trend: stable Wt Readings from Last 3 Encounters:  12/02/18 113 lb (51.3 kg)  09/02/18 114 lb (51.7 kg)  06/02/18 117 lb 6.4 oz (53.3 kg)    Current diet: in general, a "healthy" diet    ------------------------------------------------------------------------  No Known Allergies   Current Outpatient Medications:  .  amLODipine (NORVASC) 2.5 MG tablet, Take 1 tablet (2.5 mg total) by mouth daily., Disp: 30 tablet, Rfl: 11 .  Calcium Carbonate (CALCIUM 600 PO), Take 1 tablet by mouth daily., Disp: , Rfl:  .  hydrochlorothiazide (HYDRODIURIL) 25 MG tablet, Take 1 tablet (25 mg total) by mouth daily., Disp: 30 tablet, Rfl: 12 .  levothyroxine (SYNTHROID, LEVOTHROID) 88 MCG tablet, Take 1 tablet (88 mcg total) by mouth daily., Disp: 30 tablet,  Rfl: 11 .  triazolam (HALCION) 0.25 MG tablet, Take 0.5-1 tablets (0.125-0.25 mg total) by mouth at bedtime as needed., Disp: 30 tablet, Rfl: 3  Review of Systems  Constitutional: Negative for appetite change, chills, fatigue and fever.  Respiratory: Negative for chest tightness and shortness of breath.   Cardiovascular: Negative for chest pain and palpitations.  Gastrointestinal: Negative for abdominal pain, nausea and vomiting.  Neurological: Negative for dizziness and weakness.    Social History   Tobacco Use  . Smoking status: Former Smoker    Packs/day: 0.50    Years: 15.00    Pack years: 7.50    Types: Cigarettes  . Smokeless tobacco: Never Used  . Tobacco comment: Quit in the 1970s  Substance Use Topics  . Alcohol use: Yes    Alcohol/week: 7.0 standard drinks    Types: 7 Glasses of wine per week      Objective:   BP 120/72 (BP Location: Right Arm, Cuff Size: Normal)   Pulse 67   Temp 98.4 F (36.9 C) (Oral)   Resp 16   Wt 113 lb (51.3 kg)   SpO2 98% Comment: room air  BMI 21.35 kg/m  Vitals:   12/02/18 1122 12/02/18 1125 12/02/18 1128  BP: (!) 120/58 128/60 120/72  Pulse: 67    Resp: 16    Temp: 98.4 F (36.9 C)    TempSrc: Oral    SpO2: 98%    Weight: 113 lb (51.3 kg)       Physical Exam  General appearance: alert, well developed, well nourished, cooperative and  in no distress Head: Normocephalic, without obvious abnormality, atraumatic Respiratory: Respirations even and unlabored, normal respiratory rate Extremities: No gross deformities Skin: Skin color, texture, turgor normal. No rashes seen  Psych: Appropriate mood and affect. Neurologic: Mental status: Alert, oriented to person, place, and time, thought content appropriate.     Assessment & Plan    1. Essential (primary) hypertension Much better controlled. Continue current medications.  Continue routine annual [place] and follow up.      The entirety of the information documented in the  History of Present Illness, Review of Systems and Physical Exam were personally obtained by me. Portions of this information were initially documented by Awilda Billoshena Chambers, CMA and reviewed by me for thoroughness and accuracy.   Mila Merryonald Natasa Stigall, MD  [facility]

## 2019-05-05 ENCOUNTER — Encounter: Payer: Self-pay | Admitting: Family Medicine

## 2019-05-05 ENCOUNTER — Ambulatory Visit (INDEPENDENT_AMBULATORY_CARE_PROVIDER_SITE_OTHER): Payer: Medicare Other | Admitting: Family Medicine

## 2019-05-05 ENCOUNTER — Other Ambulatory Visit: Payer: Self-pay

## 2019-05-05 VITALS — BP 178/76 | HR 68 | Temp 97.1°F | Resp 16 | Wt 114.0 lb

## 2019-05-05 DIAGNOSIS — M25572 Pain in left ankle and joints of left foot: Secondary | ICD-10-CM

## 2019-05-05 DIAGNOSIS — I10 Essential (primary) hypertension: Secondary | ICD-10-CM

## 2019-05-05 DIAGNOSIS — M25472 Effusion, left ankle: Secondary | ICD-10-CM

## 2019-05-05 NOTE — Patient Instructions (Addendum)
.   Please review the attached list of medications and notify my office if there are any errors.   . Please bring all of your medications to every appointment so we can make sure that our medication list is the same as yours.    The CDC recommends two doses of Shingrix (the shingles vaccine) separated by 2 to 6 months for adults age 83 years and older. I recommend checking with your insurance plan regarding coverage for this vaccine.   

## 2019-05-05 NOTE — Progress Notes (Signed)
Patient: Isabel Myers Female    DOB: 09/29/1929   85 y.o.   MRN: [id number] Visit Date: 05/05/2019  Today's Provider: Mila Merry, MD   Chief Complaint  Patient presents with  . Foot Pain   Subjective:     HPI Foot pain: Patient complains of pain and swelling of the left foot. Symptoms started several months ago, and has gradually worsened. Patient states the pain and swelling are starting to affect how she walks. She is unable to wear a regular shoe. Patient denies any injury to her foot. Patient stopped taking Amlodipine 2-3 weeks ago because she thought it was contributing to the swelling in her foot, but swelling was only on left and did not improve when amlodipine was stopped.   No Known Allergies   Current Outpatient Medications:  .  Calcium Carbonate (CALCIUM 600 PO), Take 1 tablet by mouth daily., Disp: , Rfl:  .  hydrochlorothiazide (HYDRODIURIL) 25 MG tablet, Take 1 tablet (25 mg total) by mouth daily., Disp: 30 tablet, Rfl: 12 .  levothyroxine (SYNTHROID, LEVOTHROID) 88 MCG tablet, Take 1 tablet (88 mcg total) by mouth daily., Disp: 30 tablet, Rfl: 11 .  triazolam (HALCION) 0.25 MG tablet, Take 0.5-1 tablets (0.125-0.25 mg total) by mouth at bedtime as needed., Disp: 30 tablet, Rfl: 3 .  amLODipine (NORVASC) 2.5 MG tablet, Take 1 tablet (2.5 mg total) by mouth daily. (Patient not taking: Reported on 05/05/2019), Disp: 30 tablet, Rfl: 11  Review of Systems  Constitutional: Negative.   Respiratory: Negative.   Cardiovascular: Negative.   Musculoskeletal: Positive for arthralgias (left foot), gait problem and joint swelling (left foot).       Left foot pain     Social History   Tobacco Use  . Smoking status: Former Smoker    Packs/day: 0.50    Years: 15.00    Pack years: 7.50    Types: Cigarettes  . Smokeless tobacco: Never Used  . Tobacco comment: Quit in the 1970s  Substance Use Topics  . Alcohol use: Yes    Alcohol/week: 7.0 standard drinks   Types: 7 Glasses of wine per week      Objective:   BP (!) 178/76 (BP Location: Right Arm, Cuff Size: Normal)   Pulse 68   Temp (!) 97.1 F (36.2 C) (Temporal)   Resp 16   Wt 114 lb (51.7 kg)   SpO2 98% Comment: room air  BMI 21.54 kg/m  Vitals:   05/05/19 1027 05/05/19 1034  BP: (!) 166/76 (!) 178/76  Pulse: 68   Resp: 16   Temp: (!) 97.1 F (36.2 C)   TempSrc: Temporal   SpO2: 98%   Weight: 114 lb (51.7 kg)   Body mass index is 21.54 kg/m.   Physical Exam  General appearance: Well developed, well nourished female, cooperative and in no acute distress Head: Normocephalic, without obvious abnormality, atraumatic Respiratory: Respirations even and unlabored, normal respiratory rate Extremities: moderate left ankle swelling. No erythema. No tenderness, mild pain with limits of ROM.      Assessment & Plan    1. Acute left ankle pain  - Uric acid  2. Left ankle swelling  - Uric acid  3. Essential (primary) hypertension Was well controlled when she was taking amlodipine. Advised patient that amlodipine was not likely related to unilateral ankle swelling and to restart. Medication.    The entirety of the information documented in the History of Present Illness, Review of Systems and  Physical Exam were personally obtained by me. Portions of this information were initially documented by Meyer Cory, CMA and reviewed by me for thoroughness and accuracy.      Lelon Huh, MD  [facility]

## 2019-05-06 LAB — URIC ACID: Uric Acid: 4.9 mg/dL (ref 2.5–7.1)

## 2019-05-08 ENCOUNTER — Telehealth: Payer: Self-pay

## 2019-05-08 NOTE — Telephone Encounter (Signed)
Attempted to contact patient, no answer or voicemail. 

## 2019-05-08 NOTE — Telephone Encounter (Signed)
-----   Message from Birdie Sons, MD sent at 05/07/2019  9:17 AM EDT ----- Uric acid level is normal, so probably does not have gout. Most likely has minor sprain in ankle ligament. She should wear on OTC elastic ankle brace for 1-2 weeks. If swelling is not better after 2 weeks then we will get xrays of foot and ankle.

## 2019-05-08 NOTE — Telephone Encounter (Signed)
Patient advised.

## 2019-05-22 ENCOUNTER — Other Ambulatory Visit: Payer: Self-pay

## 2019-05-22 ENCOUNTER — Encounter: Payer: Self-pay | Admitting: Intensive Care

## 2019-05-22 ENCOUNTER — Emergency Department
Admission: EM | Admit: 2019-05-22 | Discharge: 2019-05-22 | Disposition: A | Discharge status: Home / Self Care | Payer: Medicare Other | Attending: Emergency Medicine | Admitting: Emergency Medicine

## 2019-05-22 DIAGNOSIS — E039 Hypothyroidism, unspecified: Secondary | ICD-10-CM | POA: Insufficient documentation

## 2019-05-22 DIAGNOSIS — Z79899 Other long term (current) drug therapy: Secondary | ICD-10-CM | POA: Diagnosis not present

## 2019-05-22 DIAGNOSIS — T43211A Poisoning by selective serotonin and norepinephrine reuptake inhibitors, accidental (unintentional), initial encounter: Secondary | ICD-10-CM | POA: Insufficient documentation

## 2019-05-22 DIAGNOSIS — Z87891 Personal history of nicotine dependence: Secondary | ICD-10-CM | POA: Insufficient documentation

## 2019-05-22 DIAGNOSIS — I10 Essential (primary) hypertension: Secondary | ICD-10-CM | POA: Insufficient documentation

## 2019-05-22 DIAGNOSIS — R5383 Other fatigue: Secondary | ICD-10-CM | POA: Diagnosis present

## 2019-05-22 LAB — CBC WITH DIFFERENTIAL/PLATELET
Abs Immature Granulocytes: 0.07 10*3/uL (ref 0.00–0.07)
Basophils Absolute: 0.1 10*3/uL (ref 0.0–0.1)
Basophils Relative: 1 %
Eosinophils Absolute: 0.2 10*3/uL (ref 0.0–0.5)
Eosinophils Relative: 3 %
HCT: 43.4 % (ref 36.0–46.0)
Hemoglobin: 14.3 g/dL (ref 12.0–15.0)
Immature Granulocytes: 1 %
Lymphocytes Relative: 32 %
Lymphs Abs: 2.1 10*3/uL (ref 0.7–4.0)
MCH: 28.9 pg (ref 26.0–34.0)
MCHC: 32.9 g/dL (ref 30.0–36.0)
MCV: 87.7 fL (ref 80.0–100.0)
Monocytes Absolute: 0.6 10*3/uL (ref 0.1–1.0)
Monocytes Relative: 9 %
Neutro Abs: 3.5 10*3/uL (ref 1.7–7.7)
Neutrophils Relative %: 54 %
Platelets: 310 10*3/uL (ref 150–400)
RBC: 4.95 MIL/uL (ref 3.87–5.11)
RDW: 13.4 % (ref 11.5–15.5)
WBC: 6.6 10*3/uL (ref 4.0–10.5)
nRBC: 0 % (ref 0.0–0.2)

## 2019-05-22 LAB — COMPREHENSIVE METABOLIC PANEL
ALT: 12 U/L (ref 0–44)
AST: 24 U/L (ref 15–41)
Albumin: 4.1 g/dL (ref 3.5–5.0)
Alkaline Phosphatase: 48 U/L (ref 38–126)
Anion gap: 9 (ref 5–15)
BUN: 13 mg/dL (ref 8–23)
CO2: 28 mmol/L (ref 22–32)
Calcium: 9.3 mg/dL (ref 8.9–10.3)
Chloride: 97 mmol/L — ABNORMAL LOW (ref 98–111)
Creatinine, Ser: 0.55 mg/dL (ref 0.44–1.00)
GFR calc Af Amer: >60 mL/min (ref >60)
GFR calc non Af Amer: >60 mL/min (ref >60)
Glucose, Bld: 99 mg/dL (ref 70–99)
Potassium: 3.7 mmol/L (ref 3.5–5.1)
Sodium: 134 mmol/L — ABNORMAL LOW (ref 135–145)
Total Bilirubin: 1 mg/dL (ref 0.3–1.2)
Total Protein: 7.4 g/dL (ref 6.5–8.1)

## 2019-05-22 LAB — URINALYSIS, COMPLETE (UACMP) WITH MICROSCOPIC
Bacteria, UA: NONE SEEN
Bilirubin Urine: NEGATIVE
Glucose, UA: NEGATIVE mg/dL
Hgb urine dipstick: NEGATIVE
Ketones, ur: NEGATIVE mg/dL
Nitrite: NEGATIVE
Protein, ur: NEGATIVE mg/dL
Specific Gravity, Urine: 1.006 (ref 1.005–1.030)
pH: 7 (ref 5.0–8.0)

## 2019-05-22 LAB — TROPONIN I (HIGH SENSITIVITY): Troponin I (High Sensitivity): 4 ng/L (ref <18)

## 2019-05-22 NOTE — Discharge Instructions (Addendum)
Your work-up was reassuring with no evidence of UTI or electrolyte abnormalities.  Her sleepiness was most likely secondary to accidentally taking 1 trazodone this morning.  She had no headache or neuro deficits to suggest any head bleed.  Patient was able to ambulate and feels close to back to her baseline.  Return to the ER for any other concerns

## 2019-05-22 NOTE — ED Notes (Signed)
[facility] [place] able to come pick patient up. Pt able to be d/c to lobby.

## 2019-05-22 NOTE — ED Provider Notes (Signed)
[facility] Emergency Department Provider Note  ____________________________________________   None    (approximate)   I have reviewed the triage vital signs and the nursing notes.   Patient has been triaged with a MSE exam performed by myself at a minimum. Based on symptoms and screening exam, patient may receive a more in-depth exam, labs, imaging as detailed below. Patients have been advised of this setting and exam type at the time of patient interview.    HISTORY  Chief Complaint Fatigue    HPI Isabel Myers is a 85 y.o. female presents to the emergency department with a complaint of confusion, lethargy. Patient took a trazadone this morning by mistake. Has been slightly confused, sleepy, lethargic since taking medication. No complaints prior to taking the medication..   Patient will receive a medical screening exam as detailed below.  Based off of this exam, more in depth exam, labs, imaging will be performed as needed for complaint.  Patient care will be eventually transferred to another provider in the emergency department for final exam, diagnosis and disposition.    Past Medical History:  Diagnosis Date  . Hypertension   . Thyroid disease    hypothyroid    Patient Active Problem List   Diagnosis Date Noted  . Hypokalemia 09/16/2015  . Hypothyroidism 09/16/2015  . Insomnia 09/16/2015  . Low back pain 09/16/2015  . Osteoarthritis 09/16/2015  . Spondylolisthesis of lumbar region 09/16/2015  . Degeneration of intervertebral disc of lumbar region 01/01/2015  . Lumbar canal stenosis 01/01/2015  . Asymptomatic varicose veins of lower extremity 10/26/2008  . Essential (primary) hypertension 07/13/1998    Past Surgical History:  Procedure Laterality Date  . ABDOMINAL HYSTERECTOMY  1987   with BSO  . Bladder tack  2009   Dr. Liz Beach  . CT Cervical Spine  10/22/2011   negative  . CT Scan of head  10/22/2011   without  contrast, [facility], Normal  . THYROIDECTOMY  1937   SUBTOTAL  . TONSILLECTOMY AND ADENOIDECTOMY      Prior to Admission medications   Medication Sig Start Date End Date Taking? Authorizing Provider  amLODipine (NORVASC) 2.5 MG tablet Take 1 tablet (2.5 mg total) by mouth daily. Patient not taking: Reported on 05/05/2019 09/02/18   Birdie Sons, MD  Calcium Carbonate (CALCIUM 600 PO) Take 1 tablet by mouth daily. 10/26/08   [provider]  hydrochlorothiazide (HYDRODIURIL) 25 MG tablet Take 1 tablet (25 mg total) by mouth daily. 08/15/18   Birdie Sons, MD  levothyroxine (SYNTHROID, LEVOTHROID) 88 MCG tablet Take 1 tablet (88 mcg total) by mouth daily. 08/15/18   Birdie Sons, MD  triazolam (HALCION) 0.25 MG tablet Take 0.5-1 tablets (0.125-0.25 mg total) by mouth at bedtime as needed. 08/15/18   Birdie Sons, MD    Allergies Patient has no known allergies.  Family History  Problem Relation Age of Onset  . Memory loss Sister        short term memory loss    Social History Social History   Tobacco Use  . Smoking status: Former Smoker    Packs/day: 0.50    Years: 15.00    Pack years: 7.50    Types: Cigarettes  . Smokeless tobacco: Never Used  . Tobacco comment: Quit in the 1970s  Substance Use Topics  . Alcohol use: Yes    Alcohol/week: 7.0 standard drinks    Types: 7 Glasses of wine per week  . Drug use:  No    Review of Systems Constitutional: no fever. Mild confusion, lethargy ENT: no nasal congestion/rhinorhea. no sore throat Cardiovascular: no chest pain. Respiratory: no cough. no shortness of breath/difficulty breathing Gastroenterology: no abdominal pain Musculoskeletal: no for musculoskeletal pain Integumentary: Negative for rash. Neurological: No focal weakness nor numbness.    ____________________________________________   PHYSICAL EXAM:  VITAL SIGNS: ED Triage Vitals  Enc Vitals Group     BP      Pulse      Resp      Temp       Temp src      SpO2      Weight      Height      Head Circumference      Peak Flow      Pain Score      Pain Loc      Pain Edu?      Excl. in GC?     Constitutional: Alert and oriented. Patient is drowsy but awake. Generally well appearing and in no acute distress. Eyes: Conjunctivae are normal.  Nose: No significant congestion/rhinnorhea. Mouth: No gross oropharyngeal edema. no erythema/edema Neck: No stridor.  No meningeal signs.   Cardiovascular: Grossly normal heart sounds. Respiratory: Normal respiratory effort without significant tachypnea and no observed retractions. Lungs CTAB Gastrointestinal: No significant visible abdominal wall findings.  Bowel sounds x4 quadrants. no tenderness to palpation. Musculoskeletal: No gross deformities of extremities. Neurologic:  Normal speech and language. No gross focal neurologic deficits are appreciated. CN II-XII grossly intact. Equal grip strength Skin:  Skin is warm, dry and intact. No rash noted.    ____________________________________________   LABS (all labs ordered are listed, but only abnormal results are displayed)  Labs Reviewed  URINALYSIS, COMPLETE (UACMP) WITH MICROSCOPIC  COMPREHENSIVE METABOLIC PANEL  CBC WITH DIFFERENTIAL/PLATELET  LACTIC ACID, PLASMA  LACTIC ACID, PLASMA  CBG MONITORING, ED  TROPONIN I (HIGH SENSITIVITY)    ____________________________________________   RADIOLOGY   Official radiology report(s): No results found.  ____________________________________________    INITIAL IMPRESSION / MDM / ASSESSMENT AND PLAN / ED COURSE  As part of my medical decision making, I reviewed the following data within the electronic MEDICAL RECORD NUMBER Notes from prior ED visits      Clinical Impression: Accidental medication ingestion, drowsiness   Plan: Labs, EKG. No imaging at this time as patient denies HA, trauma, and neuro exam is reassuring. Symptoms began after taking trazadone  Patient has been  screened based based on their arrival complaint, evaluated for an emergent condition, and at a minimum has received a medical screening exam.  At this time, patient will receive the further work-up listed above that was determined by medical screening exam.  Patient care will be transferred to another provider in the emergency department once patient is roomed for final diagnosis and disposition.    ____________________________________________  Note:  This document was prepared using Conservation officer, historic buildings and may include unintentional dictation errors.    Racheal Patches, PA-C 05/22/19 1243    Emily Filbert, MD 05/22/19 [phone]

## 2019-05-22 NOTE — ED Triage Notes (Signed)
Patient from [facility]. Reports accidentally taking her sleeping pill (1) this AM. [facility] staff found patient lethargic in room and sent to ER. A&O x4 in triage.

## 2019-05-22 NOTE — ED Provider Notes (Signed)
[facility] Emergency Department Provider Note  ____________________________________________   First MD Initiated Contact with Patient 05/22/19 1350     (approximate)  I have reviewed the triage vital signs and the nursing notes.   HISTORY  Chief Complaint Fatigue    HPI Isabel Myers is a 85 y.o. female with hypertension who presents for fatigue.  Patient was found lethargic in the room on the bed.  No evidence of trauma..  Patient took one of her sleeping pills this morning.  It was a trazodone around 730AM.  She says that was done on accident.  She thought it was her thyroid medicine.  She is not trying to hurt herself.  She says she felt very loopy afterwards and she was changing her coat close and then she fell asleep.  She does not think she fell and hit her head.  EMS said that they did find her in the bed and there was no signs of trauma.  Patient says that she feels at her baseline self now.  Denies any headache or confusion.  She is not on any blood thinners.  Repeat fatigue onset was shortly after the trazodone, constant, nothing made it better but just time, worse with taking the medicine.           Past Medical History:  Diagnosis Date  . Hypertension   . Thyroid disease    hypothyroid    Patient Active Problem List   Diagnosis Date Noted  . Hypokalemia 09/16/2015  . Hypothyroidism 09/16/2015  . Insomnia 09/16/2015  . Low back pain 09/16/2015  . Osteoarthritis 09/16/2015  . Spondylolisthesis of lumbar region 09/16/2015  . Degeneration of intervertebral disc of lumbar region 01/01/2015  . Lumbar canal stenosis 01/01/2015  . Asymptomatic varicose veins of lower extremity 10/26/2008  . Essential (primary) hypertension 07/13/1998    Past Surgical History:  Procedure Laterality Date  . ABDOMINAL HYSTERECTOMY  1987   with BSO  . Bladder tack  2009   Dr. Chrissie Noaefranchesco  . CT Cervical Spine  10/22/2011   negative  . CT Scan of  head  10/22/2011   without contrast, [facility], Normal  . THYROIDECTOMY  1937   SUBTOTAL  . TONSILLECTOMY AND ADENOIDECTOMY      Prior to Admission medications   Medication Sig Start Date End Date Taking? Authorizing Provider  amLODipine (NORVASC) 2.5 MG tablet Take 1 tablet (2.5 mg total) by mouth daily. Patient not taking: Reported on 05/05/2019 09/02/18   Malva LimesFisher, Donald E, MD  Calcium Carbonate (CALCIUM 600 PO) Take 1 tablet by mouth daily. 10/26/08   [provider]  hydrochlorothiazide (HYDRODIURIL) 25 MG tablet Take 1 tablet (25 mg total) by mouth daily. 08/15/18   Malva LimesFisher, Donald E, MD  levothyroxine (SYNTHROID, LEVOTHROID) 88 MCG tablet Take 1 tablet (88 mcg total) by mouth daily. 08/15/18   Malva LimesFisher, Donald E, MD  triazolam (HALCION) 0.25 MG tablet Take 0.5-1 tablets (0.125-0.25 mg total) by mouth at bedtime as needed. 08/15/18   Malva LimesFisher, Donald E, MD    Allergies Patient has no known allergies.  Family History  Problem Relation Age of Onset  . Memory loss Sister        short term memory loss    Social History Social History   Tobacco Use  . Smoking status: Former Smoker    Packs/day: 0.50    Years: 15.00    Pack years: 7.50    Types: Cigarettes  . Smokeless tobacco: Never Used  .  Tobacco comment: Quit in the 1970s  Substance Use Topics  . Alcohol use: Yes    Alcohol/week: 7.0 standard drinks    Types: 7 Glasses of wine per week  . Drug use: No      Review of Systems Constitutional: No fever/chills Eyes: No visual changes. ENT: No sore throat. Cardiovascular: Denies chest pain. Respiratory: Denies shortness of breath. Gastrointestinal: No abdominal pain.  No nausea, no vomiting.  No diarrhea.  No constipation. Genitourinary: Negative for dysuria. Musculoskeletal: Negative for back pain. Skin: Negative for rash. Neurological: Negative for headaches, focal weakness or numbness. All other ROS negative ____________________________________________   PHYSICAL  EXAM:  VITAL SIGNS: ED Triage Vitals  Enc Vitals Group     BP 05/22/19 1238 (!) 166/66     Pulse Rate 05/22/19 1238 61     Resp 05/22/19 1238 14     Temp 05/22/19 1238 97.6 F (36.4 C)     Temp Source 05/22/19 1238 Oral     SpO2 05/22/19 1238 100 %     Weight 05/22/19 1240 115 lb (52.2 kg)     Height 05/22/19 1240 5\' 1"  (1.549 m)     Head Circumference --      Peak Flow --      Pain Score 05/22/19 1240 0     Pain Loc --      Pain Edu? --      Excl. in GC? --     Constitutional: Alert and oriented. Well appearing and in no acute distress. Eyes: Conjunctivae are normal. EOMI. Head: Atraumatic. Nose: No congestion/rhinnorhea. Mouth/Throat: Mucous membranes are moist.   Neck: No stridor. Trachea Midline. FROM Cardiovascular: Normal rate, regular rhythm. Grossly normal heart sounds.  Good peripheral circulation. Respiratory: Normal respiratory effort.  No retractions. Lungs CTAB. Gastrointestinal: Soft and nontender. No distention. No abdominal bruits.  Musculoskeletal: No lower extremity tenderness nor edema.  No joint effusions. Neurologic: Cranial nerves II through XII are intact.  Equal strength in arms and legs.  No sensation changes. Skin:  Skin is warm, dry and intact. No rash noted. Psychiatric: Mood and affect are normal. Speech and behavior are normal. GU: Deferred   ____________________________________________   LABS (all labs ordered are listed, but only abnormal results are displayed)  Labs Reviewed  URINALYSIS, COMPLETE (UACMP) WITH MICROSCOPIC - Abnormal; Notable for the following components:      Result Value   Color, Urine STRAW (*)    APPearance CLEAR (*)    Leukocytes,Ua TRACE (*)    All other components within normal limits  COMPREHENSIVE METABOLIC PANEL - Abnormal; Notable for the following components:   Sodium 134 (*)    Chloride 97 (*)    All other components within normal limits  CBC WITH DIFFERENTIAL/PLATELET  CBG MONITORING, ED  TROPONIN I  (HIGH SENSITIVITY)  TROPONIN I (HIGH SENSITIVITY)   ____________________________________________   ED ECG REPORT I, 13/09/20, the attending physician, personally viewed and interpreted this ECG.  EKG sinus bradycardia rate of 56, no ST elevation, no T wave inversion, normal intervals __  INITIAL IMPRESSION / ASSESSMENT AND PLAN / ED COURSE  SAWSAN RIGGIO was evaluated in Emergency Department on 05/22/2019 for the symptoms described in the history of present illness. She was evaluated in the context of the global COVID-19 pandemic, which necessitated consideration that the patient might be at risk for infection with the SARS-CoV-2 virus that causes COVID-19. Institutional protocols and algorithms that pertain to the evaluation of patients at risk  for COVID-19 are in a state of rapid change based on information released by regulatory bodies including the CDC and federal and state organizations. These policies and algorithms were followed during the patient's care in the ED.    Patient is a 85 year old who presents with accidental use of her trazodone this morning.  Patient was found sleeping in her bed.  Patient head is without any evidence of trauma and she is completely neurologically at baseline.  She has been observed in the ER for 3 hours.  Her ingestion time was around 730 this morning.  Patient is steady on her feet.  Labs were ordered to evaluate for AKI, electrolyte abnormalities, UTI.  Labs were reassuring no evidence of other cause of patient's sleepiness.  I discussed the provisional nature of ED diagnosis, the treatment so far, the ongoing plan of care, follow up appointments and return precautions with the patient and any family or support people present. They expressed understanding and agreed with the plan, discharged home.    ____________________________________________   FINAL CLINICAL IMPRESSION(S) / ED DIAGNOSES   Final diagnoses:  Overdose of trazodone       MEDICATIONS GIVEN DURING THIS VISIT:  Medications - No data to display   ED Discharge Orders    None       Note:  This document was prepared using Dragon voice recognition software and may include unintentional dictation errors.   Vanessa [place], MD 05/22/19 1459

## 2019-06-05 NOTE — Progress Notes (Deleted)
Subjective:   Isabel Myers is a 85 y.o. female who presents for Medicare Annual (Subsequent) preventive examination.    This visit is being conducted through telemedicine due to the COVID-19 pandemic. This patient has given me verbal consent via doximity to conduct this visit, patient states they are participating from their home address. Some vital signs may be absent or patient reported.    Patient identification: identified by name, DOB, and current address  Review of Systems:  N/A        Objective:     Vitals: There were no vitals taken for this visit.  There is no height or weight on file to calculate BMI. Unable to obtain vitals due to visit being conducted via telephonically.   Advanced Directives 05/22/2019 06/02/2018 05/31/2017  Does Patient Have a Medical Advance Directive? Yes Yes Yes  Type of Advance Directive Living will Living will;Healthcare Power of [employer] Corporationtt[employer] Healthcare Power of [place];Living will  Copy of Healthcare Power of Attorney in Chart? - No - copy requested No - copy requested  Would patient like information on creating a medical advance directive? No - Patient declined - -    Tobacco Social History   Tobacco Use  Smoking Status Former Smoker  . Packs/day: 0.50  . Years: 15.00  . Pack years: 7.50  . Types: Cigarettes  Smokeless Tobacco Never Used  Tobacco Comment   Quit in the 1970s     Counseling given: Not Answered Comment: Quit in the 1970s   Clinical Intake:                       Past Medical History:  Diagnosis Date  . Hypertension   . Thyroid disease    hypothyroid   Past Surgical History:  Procedure Laterality Date  . ABDOMINAL HYSTERECTOMY  1987   with BSO  . Bladder tack  2009   Dr. Chrissie Noaefranchesco  . CT Cervical Spine  10/22/2011   negative  . CT Scan of head  10/22/2011   without contrast, [facility], Normal  . THYROIDECTOMY  1937   SUBTOTAL  . TONSILLECTOMY AND ADENOIDECTOMY     Family History   Problem Relation Age of Onset  . Memory loss Sister        short term memory loss   Social History   Socioeconomic History  . Marital status: Married    Spouse name: Not on file  . Number of children: 2  . Years of education: Not on file  . Highest education level: Master's degree (e.g., MA, MS, MEng, MEd, MSW, MBA)  Occupational History  . Occupation: Retired  Engineer, productionocial Needs  . Financial resource strain: Not hard at all  . Food insecurity    Worry: Never true    Inability: Never true  . Transportation needs    Medical: No    Non-medical: No  Tobacco Use  . Smoking status: Former Smoker    Packs/day: 0.50    Years: 15.00    Pack years: 7.50    Types: Cigarettes  . Smokeless tobacco: Never Used  . Tobacco comment: Quit in the 1970s  Substance and Sexual Activity  . Alcohol use: Yes    Alcohol/week: 7.0 standard drinks    Types: 7 Glasses of wine per week  . Drug use: No  . Sexual activity: Not on file  Lifestyle  . Physical activity    Days per week: 0 days    Minutes per session: 0 min  .  Stress: Not on file  Relationships  . Social Musician on phone: Patient refused    Gets together: Patient refused    Attends religious service: Patient refused    Active member of club or organization: Patient refused    Attends meetings of clubs or organizations: Patient refused    Relationship status: Patient refused  Other Topics Concern  . Not on file  Social History Narrative  . Not on file    Outpatient Encounter Medications as of 06/06/2019  Medication Sig  . amLODipine (NORVASC) 2.5 MG tablet Take 1 tablet (2.5 mg total) by mouth daily. (Patient not taking: Reported on 05/05/2019)  . Calcium Carbonate (CALCIUM 600 PO) Take 1 tablet by mouth daily.  . hydrochlorothiazide (HYDRODIURIL) 25 MG tablet Take 1 tablet (25 mg total) by mouth daily.  Marland Kitchen levothyroxine (SYNTHROID, LEVOTHROID) 88 MCG tablet Take 1 tablet (88 mcg total) by mouth daily.  . triazolam  (HALCION) 0.25 MG tablet Take 0.5-1 tablets (0.125-0.25 mg total) by mouth at bedtime as needed.   No facility-administered encounter medications on file as of 06/06/2019.     Activities of Daily Living No flowsheet data found.  Patient Care Team: [name], MD as PCP - General (Family Medicine) Sallee Lange, MD (Ophthalmology)    Assessment:   This is a routine wellness examination for Isabel Myers.  Exercise Activities and Dietary recommendations    Goals    . DIET - WATER INTAKE     Continue drinking 6-8 glasses of water a day.        Fall Risk: Fall Risk  06/02/2018 05/31/2017 03/03/2016  Falls in the past year? 0 No No    FALL RISK PREVENTION PERTAINING TO THE HOME:  Any stairs in or around the home? {YES/NO:21197} If so, are there any without handrails? {YES/NO:21197}  Home free of loose throw rugs in walkways, pet beds, electrical cords, etc? Yes  Adequate lighting in your home to reduce risk of falls? Yes   ASSISTIVE DEVICES UTILIZED TO PREVENT FALLS:  Life alert? {YES/NO:21197} Use of a cane, walker or w/c? {YES/NO:21197} Grab bars in the bathroom? {YES/NO:21197} Shower chair or bench in shower? {YES/NO:21197} Elevated toilet seat or a handicapped toilet? {YES/NO:21197}   TIMED UP AND GO:  Was the test performed? No .    Depression Screen PHQ 2/9 Scores 06/02/2018 06/02/2018 05/31/2017 05/31/2017  PHQ - 2 Score 0 0 0 0  PHQ- 9 Score 0 - 0 -     Cognitive Function     6CIT Screen 06/02/2018 05/31/2017  What Year? 0 points 0 points  What month? 0 points -  What time? 0 points 0 points  Count back from 20 0 points 0 points  Months in reverse 0 points 0 points  Repeat phrase 0 points 0 points  Total Score 0 -    Immunization History  Administered Date(s) Administered  . DTaP 07/28/1993  . Influenza-Unspecified 04/12/2016, 04/23/2018  . Pneumococcal Conjugate-13 04/06/2014  . Pneumococcal Polysaccharide-23 07/28/2006  . Td  08/09/2007  . Tdap 11/30/2011  . Zoster 03/05/2006    Qualifies for Shingles Vaccine? Yes  Zostavax completed 03/05/06. Due for Shingrix. Pt has been advised to call insurance company to determine out of pocket expense. Advised may also receive vaccine at local pharmacy or Health Dept. Verbalized acceptance and understanding.  Tdap: Up to date  Flu Vaccine: Due for Flu vaccine. Does the patient want to receive this vaccine today?  {YES/NO:21197}. Education has been  provided regarding the importance of this vaccine but still declined. Advised may receive this vaccine at local pharmacy or Health Dept. Aware to provide a copy of the vaccination record if obtained from local pharmacy or Health Dept. Verbalized acceptance and understanding.  Pneumococcal Vaccine: Completed series  Screening Tests Health Maintenance  Topic Date Due  . INFLUENZA VACCINE  02/11/2019  . TETANUS/TDAP  11/29/2021  . DEXA SCAN  09/15/2022  . PNA vac Low Risk Adult  Completed    Cancer Screenings:  Colorectal Screening: No longer required.   Mammogram: No longer required.   Bone Density: Completed 09/14/17. Results reflect OSTEOPENIA. Repeat every 5 years.   Lung Cancer Screening: (Low Dose CT Chest recommended if Age 70-80 years, 30 pack-year currently smoking OR have quit w/in 15years.) does not qualify.   Additional Screening:  Vision Screening: Recommended annual ophthalmology exams for early detection of glaucoma and other disorders of the eye.  Dental Screening: Recommended annual dental exams for proper oral hygiene  Community Resource Referral:  CRR required this visit?  No       Plan:  I have personally reviewed and addressed the Medicare Annual Wellness questionnaire and have noted the following in the patient's chart:  A. Medical and social history B. Use of alcohol, tobacco or illicit drugs  C. Current medications and supplements D. Functional ability and status E.  Nutritional status F.   Physical activity G. Advance directives H. List of other physicians I.  Hospitalizations, surgeries, and ER visits in previous 12 months J.  [place] such as hearing and vision if needed, cognitive and depression L. Referrals and appointments   In addition, I have reviewed and discussed with patient certain preventive protocols, quality metrics, and best practice recommendations. A written personalized care plan for preventive services as well as general preventive health recommendations were provided to patient. Nurse Health Advisor  Signed,    Aliza Moret [place], [place]  [id number] Nurse Health Advisor   Nurse Notes: ***

## 2019-06-06 ENCOUNTER — Telehealth: Payer: Self-pay

## 2019-06-06 ENCOUNTER — Ambulatory Visit: Payer: Self-pay

## 2019-06-06 NOTE — Telephone Encounter (Signed)
Per [place] on 06/05/19:  Pt called to cancel AWV for today. Pt would like a CB on home # to r/s. Called pt an LMTCB.

## 2019-06-13 NOTE — Telephone Encounter (Signed)
Telephonic AWV scheduled for 06/14/19 @ 2:40 PM.

## 2019-06-13 NOTE — Progress Notes (Signed)
Subjective:   Isabel Myers is a 85 y.o. female who presents for Medicare Annual (Subsequent) preventive examination.    This visit is being conducted through telemedicine due to the COVID-19 pandemic. This patient has given me verbal consent via doximity to conduct this visit, patient states they are participating from their home address. Some vital signs may be absent or patient reported.    Patient identification: identified by name, DOB, and current address  Review of Systems:  N/A  Cardiac Risk Factors include: advanced age (>73men, >53 women);dyslipidemia;hypertension     Objective:     Vitals: There were no vitals taken for this visit.  There is no height or weight on file to calculate BMI. Unable to obtain vitals due to visit being conducted via telephonically.   Advanced Directives 06/14/2019 05/22/2019 06/02/2018 05/31/2017  Does Patient Have a Medical Advance Directive? Yes Yes Yes Yes  Type of Paramedic of Cash;Living will Living will Living will;Healthcare Power of [place];Living will  Copy of [place] in Chart? No - copy requested - No - copy requested No - copy requested  Would patient like information on creating a medical advance directive? - No - Patient declined - -    Tobacco Social History   Tobacco Use  Smoking Status Former Smoker   Packs/day: 0.50   Years: 15.00   Pack years: 7.50   Types: Cigarettes  Smokeless Tobacco Never Used  Tobacco Comment   Quit in the 1970s     Counseling given: Not Answered Comment: Quit in the 1970s   Clinical Intake:  Pre-visit preparation completed: Yes  Pain : 0-10 Pain Score: 7  Pain Type: Chronic pain(arthritis) Pain Location: Hand Pain Orientation: Left, Right Pain Descriptors / Indicators: Aching Pain Frequency: Intermittent     Nutritional Risks: None Diabetes: No  How often do you need to have someone help you  when you read instructions, pamphlets, or other written materials from your doctor or pharmacy?: 1 - Never  Interpreter Needed?: No  Information entered by :: [facility], LPN  Past Medical History:  Diagnosis Date   Hypertension    Thyroid disease    hypothyroid   Past Surgical History:  Procedure Laterality Date   ABDOMINAL HYSTERECTOMY  1987   with BSO   Bladder tack  2009   Dr. Liz [place]   CT Cervical Spine  10/22/2011   negative   CT Scan of head  10/22/2011   without contrast, [facility], Normal   THYROIDECTOMY  1937   SUBTOTAL   TONSILLECTOMY AND ADENOIDECTOMY     Family History  Problem Relation Age of Onset   Memory loss Sister        short term memory loss   Social History   Socioeconomic History   Marital status: Married    Spouse name: Not on file   Number of children: 2   Years of education: Not on file   Highest education level: Master's degree (e.g., MA, MS, MEng, MEd, MSW, MBA)  Occupational History   Occupation: Retired  Scientist, product/process development strain: Not hard at [employer] insecurity    Worry: Never true    Inability: Never true   Transportation needs    Medical: No    Non-medical: No  Tobacco Use   Smoking status: Former Smoker    Packs/day: 0.50    Years: 15.00    Pack years: 7.50  Types: Cigarettes   Smokeless tobacco: Never Used   Tobacco comment: Quit in the 1970s  Substance and Sexual Activity   Alcohol use: Yes    Alcohol/week: 7.0 standard drinks    Types: 7 Glasses of wine per week   Drug use: No   Sexual activity: Not on file  Lifestyle   Physical activity    Days per week: 0 days    Minutes per session: 0 min   Stress: Not at all  Relationships   Social connections    Talks on phone: Patient refused    Gets together: Patient refused    Attends religious service: Patient refused    Active member of club or organization: Patient refused    Attends meetings of clubs or organizations:  Patient refused    Relationship status: Patient refused  Other Topics Concern   Not on file  Social History Narrative   Not on file    Outpatient Encounter Medications as of 06/14/2019  Medication Sig   amLODipine (NORVASC) 2.5 MG tablet Take 1 tablet (2.5 mg total) by mouth daily. (Patient taking differently: Take 2.5 mg by mouth daily. Only takes 3 times a week)   Calcium Carbonate (CALCIUM 600 PO) Take 1 tablet by mouth daily.   hydrochlorothiazide (HYDRODIURIL) 25 MG tablet Take 1 tablet (25 mg total) by mouth daily.   levothyroxine (SYNTHROID, LEVOTHROID) 88 MCG tablet Take 1 tablet (88 mcg total) by mouth daily.   triazolam (HALCION) 0.25 MG tablet Take 0.5-1 tablets (0.125-0.25 mg total) by mouth at bedtime as needed.   No facility-administered encounter medications on file as of 06/14/2019.     Activities of Daily Living In your present state of health, do you have any difficulty performing the following activities: 06/14/2019  Hearing? Y  Comment Wears bilateral hearing aids.  Vision? N  Difficulty concentrating or making decisions? N  Walking or climbing stairs? N  Dressing or bathing? N  Doing errands, shopping? N  Preparing Food and eating ? N  Using the Toilet? N  In the past six months, have you accidently leaked urine? N  Do you have problems with loss of bowel control? N  Managing your Medications? N  Managing your Finances? N  Housekeeping or managing your Housekeeping? N  Some recent data might be hidden    Patient Care Team: [name], MD as PCP - General (Family Medicine) Dingeldein, Viviann Spare, MD (Ophthalmology) Fransico Michael, MD as Referring Physician (Audiology)    Assessment:   This is a routine wellness examination for [place].  Exercise Activities and Dietary recommendations Current Exercise Habits: Home exercise routine, Type of exercise: walking, Time (Minutes): 20, Frequency (Times/Week): 5, Weekly Exercise (Minutes/Week): 100,  Intensity: Mild, Exercise limited by: orthopedic condition(s)  Goals     DIET - WATER INTAKE     Continue drinking 6-8 glasses of water a day.        Fall Risk: Fall Risk  06/14/2019 06/02/2018 05/31/2017 03/03/2016  Falls in the past year? 0 0 No No  Number falls in past yr: 0 - - -  Injury with Fall? 0 - - -    FALL RISK PREVENTION PERTAINING TO THE HOME:  Any stairs in or around the home? No  If so, are there any without handrails? N/A  Home free of loose throw rugs in walkways, pet beds, electrical cords, etc? Yes  Adequate lighting in your home to reduce risk of falls? Yes   ASSISTIVE DEVICES UTILIZED TO  PREVENT FALLS:  Life alert? Yes  Use of a cane, walker or w/c? Yes  Grab bars in the bathroom? Yes  Shower chair or bench in shower? No  Elevated toilet seat or a handicapped toilet? Yes    TIMED UP AND GO:  Was the test performed? No .    Depression Screen PHQ 2/9 Scores 06/14/2019 06/02/2018 06/02/2018 05/31/2017  PHQ - 2 Score 0 0 0 0  PHQ- 9 Score - 0 - 0     Cognitive Function: Declined today.      6CIT Screen 06/02/2018 05/31/2017  What Year? 0 points 0 points  What month? 0 points -  What time? 0 points 0 points  Count back from 20 0 points 0 points  Months in reverse 0 points 0 points  Repeat phrase 0 points 0 points  Total Score 0 -    Immunization History  Administered Date(s) Administered   DTaP 07/28/1993   Influenza-Unspecified 04/12/2016, 04/23/2018   Pneumococcal Conjugate-13 04/06/2014   Pneumococcal Polysaccharide-23 07/28/2006   Td 08/09/2007   Tdap 11/30/2011   Zoster 03/05/2006    Qualifies for Shingles Vaccine? Yes  Zostavax completed 03/05/06. Due for Shingrix. Pt has been advised to call insurance company to determine out of pocket expense. Advised may also receive vaccine at local pharmacy or Health Dept. Verbalized acceptance and understanding.  Tdap: Up to date  Flu Vaccine: Up to date  Pneumococcal Vaccine:  Completed series  Screening Tests Health Maintenance  Topic Date Due   TETANUS/TDAP  11/29/2021   DEXA SCAN  09/15/2022   INFLUENZA VACCINE  Completed   PNA vac Low Risk Adult  Completed    Cancer Screenings:  Colorectal Screening: No longer required.   Mammogram: No longer required.   Bone Density: Completed 09/14/17. Results reflect OSTEOPENIA. Repeat every 5 years.   Lung Cancer Screening: (Low Dose CT Chest recommended if Age 51-80 years, 30 pack-year currently smoking OR have quit w/in 15years.) does not qualify.   Additional Screening:  Vision Screening: Recommended annual ophthalmology exams for early detection of glaucoma and other disorders of the eye.  Dental Screening: Recommended annual dental exams for proper oral hygiene  Community Resource Referral:  CRR required this visit?  No       Plan:  I have personally reviewed and addressed the Medicare Annual Wellness questionnaire and have noted the following in the patients chart:  A. Medical and social history B. Use of alcohol, tobacco or illicit drugs  C. Current medications and supplements D. Functional ability and status E.  Nutritional status F.  Physical activity G. Advance directives H. List of other physicians I.  Hospitalizations, surgeries, and ER visits in previous 12 months J.  Vitals K. Screenings such as hearing and vision if needed, cognitive and depression L. Referrals and appointments   In addition, I have reviewed and discussed with patient certain preventive protocols, quality metrics, and best practice recommendations. A written personalized care plan for preventive services as well as general preventive health recommendations were provided to patient. Nurse Health Advisor  Signed,    Johann Santone [place], [place]  [id number]/1/[id number]/08/2018 Nurse Health Advisor   Nurse Notes: None.

## 2019-06-14 ENCOUNTER — Other Ambulatory Visit: Payer: Self-pay

## 2019-06-14 ENCOUNTER — Ambulatory Visit (INDEPENDENT_AMBULATORY_CARE_PROVIDER_SITE_OTHER): Payer: Medicare Other

## 2019-06-14 DIAGNOSIS — Z0000 Encounter for general adult medical examination without abnormal findings: Secondary | ICD-10-CM

## 2019-06-14 NOTE — Patient Instructions (Signed)
[name] , Thank you for taking time to come for your Medicare Wellness Visit. I appreciate your ongoing commitment to your health goals. Please review the following plan we discussed and let me know if I can assist you in the future.   Screening recommendations/referrals: Colonoscopy: No longer required.  Mammogram: No longer required.  Bone Density: Up to date, due 09/2022 Recommended yearly ophthalmology/optometry visit for glaucoma screening and checkup Recommended yearly dental visit for hygiene and checkup  Vaccinations: Influenza vaccine: Up to date Pneumococcal vaccine: Completed series Tdap vaccine: Up to date, due 11/2021 Shingles vaccine: Pt declines today.     Advanced directives: Please bring a copy of your POA (Power of Attorney) and/or Living Will to your next appointment.   Conditions/risks identified: Continue to increase water intake to 6-8 8 oz glasses a day.   Next appointment: 09/12/18 @ 9:00 AM with Dr Caryn Section   Preventive Care 83 Years and Older, Female Preventive care refers to lifestyle choices and visits with your health care provider that can promote health and wellness. What does preventive care include?  A yearly physical exam. This is also called an annual well check.  Dental exams once or twice a year.  Routine eye exams. Ask your health care provider how often you should have your eyes checked.  Personal lifestyle choices, including:  Daily care of your teeth and gums.  Regular physical activity.  Eating a healthy diet.  Avoiding tobacco and drug use.  Limiting alcohol use.  Practicing safe sex.  Taking low-dose aspirin every day.  Taking vitamin and mineral supplements as recommended by your health care provider. What happens during an annual well check? The services and screenings done by your health care provider during your annual well check will depend on your age, overall health, lifestyle risk factors, and family history of  disease. Counseling  Your health care provider may ask you questions about your:  Alcohol use.  Tobacco use.  Drug use.  Emotional well-being.  Home and relationship well-being.  Sexual activity.  Eating habits.  History of falls.  Memory and ability to understand (cognition).  Work and work Statistician.  Reproductive health. Screening  You may have the following tests or measurements:  Height, weight, and BMI.  Blood pressure.  Lipid and cholesterol levels. These may be checked every 5 years, or more frequently if you are over 37 years old.  Skin check.  Lung cancer screening. You may have this screening every year starting at age 72 if you have a 30-pack-year history of smoking and currently smoke or have quit within the past 15 years.  Fecal occult blood test (FOBT) of the stool. You may have this test every year starting at age 63.  Flexible sigmoidoscopy or colonoscopy. You may have a sigmoidoscopy every 5 years or a colonoscopy every 10 years starting at age 54.  Hepatitis C blood test.  Hepatitis B blood test.  Sexually transmitted disease (STD) testing.  Diabetes screening. This is done by checking your blood sugar (glucose) after you have not eaten for a while (fasting). You may have this done every 1-3 years.  Bone density scan. This is done to screen for osteoporosis. You may have this done starting at age 91.  Mammogram. This may be done every 1-2 years. Talk to your health care provider about how often you should have regular mammograms. Talk with your health care provider about your test results, treatment options, and if necessary, the need for more tests.  Vaccines  Your health care provider may recommend certain vaccines, such as:  Influenza vaccine. This is recommended every year.  Tetanus, diphtheria, and acellular pertussis (Tdap, Td) vaccine. You may need a Td booster every 10 years.  Zoster vaccine. You may need this after age 33.   Pneumococcal 13-valent conjugate (PCV13) vaccine. One dose is recommended after age 17.  Pneumococcal polysaccharide (PPSV23) vaccine. One dose is recommended after age 40. Talk to your health care provider about which screenings and vaccines you need and how often you need them. This information is not intended to replace advice given to you by your health care provider. Make sure you discuss any questions you have with your health care provider. Document Released: 07/26/2015 Document Revised: 03/18/2016 Document Reviewed: 04/30/2015 Elsevier Interactive Patient Education  2017 [place] Prevention in the Home Falls can cause injuries. They can happen to people of all ages. There are many things you can do to make your home safe and to help prevent falls. What can I do on the outside of my home?  Regularly fix the edges of walkways and driveways and fix any cracks.  Remove anything that might make you trip as you walk through a door, such as a raised step or threshold.  Trim any bushes or trees on the path to your home.  Use bright outdoor lighting.  Clear any walking paths of anything that might make someone trip, such as rocks or tools.  Regularly check to see if handrails are loose or broken. Make sure that both sides of any steps have handrails.  Any raised decks and porches should have guardrails on the edges.  Have any leaves, snow, or ice cleared regularly.  Use sand or salt on walking paths during winter.  Clean up any spills in your garage right away. This includes oil or grease spills. What can I do in the bathroom?  Use night lights.  Install grab bars by the toilet and in the tub and shower. Do not use towel bars as grab bars.  Use non-skid mats or decals in the tub or shower.  If you need to sit down in the shower, use a plastic, non-slip stool.  Keep the floor dry. Clean up any water that spills on the floor as soon as it happens.  Remove soap  buildup in the tub or shower regularly.  Attach bath mats securely with double-sided non-slip rug tape.  Do not have throw rugs and other things on the floor that can make you trip. What can I do in the bedroom?  Use night lights.  Make sure that you have a light by your bed that is easy to reach.  Do not use any sheets or blankets that are too big for your bed. They should not hang down onto the floor.  Have a firm chair that has side arms. You can use this for support while you get dressed.  Do not have throw rugs and other things on the floor that can make you trip. What can I do in the kitchen?  Clean up any spills right away.  Avoid walking on wet floors.  Keep items that you use a lot in easy-to-reach places.  If you need to reach something above you, use a strong step stool that has a grab bar.  Keep electrical cords out of the way.  Do not use floor polish or wax that makes floors slippery. If you must use wax, use non-skid floor wax.  Do not have throw rugs and other things on the floor that can make you trip. What can I do with my stairs?  Do not leave any items on the stairs.  Make sure that there are handrails on both sides of the stairs and use them. Fix handrails that are broken or loose. Make sure that handrails are as long as the stairways.  Check any carpeting to make sure that it is firmly attached to the stairs. Fix any carpet that is loose or worn.  Avoid having throw rugs at the top or bottom of the stairs. If you do have throw rugs, attach them to the floor with carpet tape.  Make sure that you have a light switch at the top of the stairs and the bottom of the stairs. If you do not have them, ask someone to add them for you. What else can I do to help prevent falls?  Wear shoes that:  Do not have high heels.  Have rubber bottoms.  Are comfortable and fit you well.  Are closed at the toe. Do not wear sandals.  If you use a stepladder:  Make  sure that it is fully opened. Do not climb a closed stepladder.  Make sure that both sides of the stepladder are locked into place.  Ask someone to hold it for you, if possible.  Clearly mark and make sure that you can see:  Any grab bars or handrails.  First and last steps.  Where the edge of each step is.  Use tools that help you move around (mobility aids) if they are needed. These include:  Canes.  Walkers.  Scooters.  Crutches.  Turn on the lights when you go into a dark area. Replace any light bulbs as soon as they burn out.  Set up your furniture so you have a clear path. Avoid moving your furniture around.  If any of your floors are uneven, fix them.  If there are any pets around you, be aware of where they are.  Review your medicines with your doctor. Some medicines can make you feel dizzy. This can increase your chance of falling. Ask your doctor what other things that you can do to help prevent falls. This information is not intended to replace advice given to you by your health care provider. Make sure you discuss any questions you have with your health care provider. Document Released: 04/25/2009 Document Revised: 12/05/2015 Document Reviewed: 08/03/2014 Elsevier Interactive Patient Education  2017 [employer].

## 2019-08-04 ENCOUNTER — Other Ambulatory Visit: Payer: Self-pay | Admitting: Family Medicine

## 2019-09-12 ENCOUNTER — Encounter: Payer: Self-pay | Admitting: Family Medicine

## 2019-09-12 ENCOUNTER — Ambulatory Visit (INDEPENDENT_AMBULATORY_CARE_PROVIDER_SITE_OTHER): Payer: Medicare Other | Admitting: Family Medicine

## 2019-09-12 ENCOUNTER — Other Ambulatory Visit: Payer: Self-pay

## 2019-09-12 VITALS — BP 170/62 | HR 73 | Temp 97.1°F | Ht 61.0 in | Wt 116.2 lb

## 2019-09-12 DIAGNOSIS — Z0000 Encounter for general adult medical examination without abnormal findings: Secondary | ICD-10-CM | POA: Diagnosis not present

## 2019-09-12 DIAGNOSIS — E039 Hypothyroidism, unspecified: Secondary | ICD-10-CM

## 2019-09-12 DIAGNOSIS — I10 Essential (primary) hypertension: Secondary | ICD-10-CM | POA: Diagnosis not present

## 2019-09-12 MED ORDER — TRIAMTERENE-HCTZ 37.5-25 MG PO CAPS: 1.0000 | ORAL_CAPSULE | Freq: Every day | ORAL | 1 refills | Status: DC

## 2019-09-12 NOTE — Patient Instructions (Signed)
.   Please review the attached list of medications and notify my office if there are any errors.   . Please bring all of your medications to every appointment so we can make sure that our medication list is the same as yours.   

## 2019-09-12 NOTE — Progress Notes (Signed)
Patient: Isabel Myers, Female    DOB: Dec 01, 1929, 86 y.o.   MRN: [id number] Visit Date: 09/12/2019  Today's Provider: Mila Merry, MD   Chief Complaint  Patient presents with  . Annual Exam  . Hypertension  . Hypothyroidism   Subjective:     Patient had a AWE with [facility] on 06/14/2019   Annual physical exam Isabel Myers is a 86 y.o. female who presents today for health maintenance and complete physical. She feels well. She reports exercising daily walking. She reports she is sleeping well.  -----------------------------------------------------------------  Hypertension, follow-up: BP Readings from Last 3 Encounters:  09/12/19 (!) 170/62  05/22/19 (!) 180/75  05/05/19 (!) 178/76     She was last seen for hypertension 4 months ago.  BP at that visit was 178/76. Management since that visit includes advised patient to restart Amlodipine She reports fair compliance with treatment. Patient reports taking medication three times a week.  She is not having side effects.  She is exercising. She is not adherent to low salt diet.   Outside blood pressures are not being checked. She is experiencing none.  Patient denies chest pain, chest pressure/discomfort, claudication, dyspnea, exertional chest pressure/discomfort, fatigue, irregular heart beat, lower extremity edema, near-syncope, orthopnea, palpitations, paroxysmal nocturnal dyspnea, syncope and tachypnea.   Cardiovascular risk factors include advanced age (older than 27 for men, 17 for women) and hypertension.  Use of agents associated with hypertension: thyroid hormones.                Weight trend: fluctuating a bit Wt Readings from Last 3 Encounters:  09/02/18 114 lb (51.7 kg)  06/02/18 117 lb 6.4 oz (53.3 kg)  08/16/17 116 lb (52.6 kg)    Current diet: well balanced  ------------------------------------------------------------------------  Follow up for Hypothyroidism:  The patient was  last seen for this 1 year ago. Changes made at last visit include none.  She reports good compliance with treatment. She feels that condition is stable. She is not having side effects.   ------------------------------------------------------------------------------------   Review of Systems  Constitutional: Negative.   HENT: Negative.   Eyes: Negative.   Respiratory: Negative.   Cardiovascular: Negative.   Gastrointestinal: Negative.   Endocrine: Negative.   Genitourinary: Negative.   Musculoskeletal: Negative.   Skin: Negative.   Allergic/Immunologic: Negative.   Neurological: Negative.   Hematological: Negative.   Psychiatric/Behavioral: Negative.     Social History      She  reports that she has quit smoking. Her smoking use included cigarettes. She has a 7.50 pack-year smoking history. She has never used smokeless tobacco. She reports current alcohol use of about 7.0 standard drinks of alcohol per week. She reports that she does not use drugs.       Social History   Socioeconomic History  . Marital status: Married    Spouse name: Not on file  . Number of children: 2  . Years of education: Not on file  . Highest education level: Master's degree (e.g., MA, MS, MEng, MEd, MSW, MBA)  Occupational History  . Occupation: Retired  Tobacco Use  . Smoking status: Former Smoker    Packs/day: 0.50    Years: 15.00    Pack years: 7.50    Types: Cigarettes  . Smokeless tobacco: Never Used  . Tobacco comment: Quit in the 1970s  Substance and Sexual Activity  . Alcohol use: Yes    Alcohol/week: 7.0 standard drinks    Types: 7 Glasses  of wine per week  . Drug use: No  . Sexual activity: Not on file  Other Topics Concern  . Not on file  Social History Narrative  . Not on file   Social Determinants of Health   Financial Resource Strain:   . Difficulty of Paying Living Expenses: Not on file  Food Insecurity:   . Worried About Programme researcher, broadcasting/film/video in the Last Year: Not  on file  . Ran Out of Food in the Last Year: Not on file  Transportation Needs:   . Lack of Transportation (Medical): Not on file  . Lack of Transportation (Non-Medical): Not on file  Physical Activity:   . Days of Exercise per Week: Not on file  . Minutes of Exercise per Session: Not on file  Stress: No Stress Concern Present  . Feeling of Stress : Not at all  Social Connections:   . Frequency of Communication with Friends and Family: Not on file  . Frequency of Social Gatherings with Friends and Family: Not on file  . Attends Religious Services: Not on file  . Active Member of Clubs or Organizations: Not on file  . Attends Banker Meetings: Not on file  . Marital Status: Not on file    Past Medical History:  Diagnosis Date  . Hypertension   . Thyroid disease    hypothyroid     Patient Active Problem List   Diagnosis Date Noted  . Hypokalemia 09/16/2015  . Hypothyroidism 09/16/2015  . Insomnia 09/16/2015  . Low back pain 09/16/2015  . Osteoarthritis 09/16/2015  . Spondylolisthesis of lumbar region 09/16/2015  . Degeneration of intervertebral disc of lumbar region 01/01/2015  . Lumbar canal stenosis 01/01/2015  . Asymptomatic varicose veins of lower extremity 10/26/2008  . Essential (primary) hypertension 07/13/1998    Past Surgical History:  Procedure Laterality Date  . ABDOMINAL HYSTERECTOMY  1987   with BSO  . Bladder tack  2009   Dr. Chrissie Noa  . CT Cervical Spine  10/22/2011   negative  . CT Scan of head  10/22/2011   without contrast, [facility], Normal  . THYROIDECTOMY  1937   SUBTOTAL  . TONSILLECTOMY AND ADENOIDECTOMY      Family History        Family Status  Relation Name Status  . Sister  Alive  . Mother  Deceased at age 17  . Father  Deceased at age 36  . Brother  Alive  . Sister  Alive  . Brother  Alive       mentally challenged. In a group home in [place]  . Son  Alive        Her family history includes Memory loss  in her sister.      No Known Allergies   Current Outpatient Medications:  .  amLODipine (NORVASC) 2.5 MG tablet, Take 1 tablet (2.5 mg total) by mouth daily. (Patient taking differently: Take 2.5 mg by mouth daily. Only takes 3 times a week), Disp: 30 tablet, Rfl: 11 .  Calcium Carbonate (CALCIUM 600 PO), Take 1 tablet by mouth daily., Disp: , Rfl:  .  hydrochlorothiazide (HYDRODIURIL) 25 MG tablet, Take 1 tablet (25 mg total) by mouth daily., Disp: 30 tablet, Rfl: 12 .  levothyroxine (SYNTHROID) 88 MCG tablet, TAKE 1 TABLET BY MOUTH DAILY, Disp: 30 tablet, Rfl: 11 .  triazolam (HALCION) 0.25 MG tablet, Take 0.5-1 tablets (0.125-0.25 mg total) by mouth at bedtime as needed., Disp: 30 tablet, Rfl: 3  Patient Care Team: [name], MD as PCP - General (Family Medicine) Dingeldein, Viviann Spare, MD (Ophthalmology) Fransico Michael, MD as Referring Physician (Audiology)    Objective:    Vitals: BP (!) 170/62 (BP Location: Right Arm, Patient Position: Sitting, Cuff Size: Normal)   Pulse 73   Temp (!) 97.1 F (36.2 C) (Temporal)   Ht 5\' 1"  (1.549 m)   Wt 116 lb 3.2 oz (52.7 kg)   BMI 21.96 kg/m    Vitals:   09/12/19 0912  BP: (!) 170/62  Pulse: 73  Temp: (!) 97.1 F (36.2 C)  TempSrc: Temporal  Weight: 116 lb 3.2 oz (52.7 kg)  Height: 5\' 1"  (1.549 m)     Physical Exam   General Appearance:    Well developed, well nourished female. Alert, cooperative, in no acute distress, appears stated age   Head:    Normocephalic, without obvious abnormality, atraumatic  Eyes:    PERRL, conjunctiva/corneas clear, EOM's intact, fundi    benign, both eyes  Ears:    Normal TM's and external ear canals, both ears  Nose:   Nares normal, septum midline, mucosa normal, no drainage    or sinus tenderness  Throat:   Lips, mucosa, and tongue normal; teeth and gums normal  Neck:   Supple, symmetrical, trachea midline, no adenopathy;    thyroid:  no enlargement/tenderness/nodules; no carotid    bruit or JVD  Back:     Symmetric, no curvature, ROM normal, no CVA tenderness  Lungs:     Clear to auscultation bilaterally, respirations unlabored  Chest Wall:    No tenderness or deformity   Heart:    Normal heart rate. Normal rhythm. No murmurs, rubs, or gallops.   Breast Exam:    deferred  Abdomen:     Soft, non-tender, bowel sounds active all four quadrants,    no masses, no organomegaly  Pelvic:    deferred  Extremities:   All extremities are intact. No cyanosis or edema  Pulses:   2+ and symmetric all extremities  Skin:   Skin color, texture, turgor normal, no rashes or lesions  Lymph nodes:   Cervical, supraclavicular, and axillary nodes normal  Neurologic:   CNII-XII intact, normal strength, sensation and reflexes    throughout    Depression Screen PHQ 2/9 Scores 06/14/2019 06/02/2018 06/02/2018 05/31/2017  PHQ - 2 Score 0 0 0 0  PHQ- 9 Score - 0 - 0       Assessment & Plan:     Routine Health Maintenance and Physical Exam  Exercise Activities and Dietary recommendations Goals    . DIET - WATER INTAKE     Continue drinking 6-8 glasses of water a day.        Immunization History  Administered Date(s) Administered  . DTaP 07/28/1993  . Influenza-Unspecified 04/12/2016, 04/23/2018  . Pneumococcal Conjugate-13 04/06/2014  . Pneumococcal Polysaccharide-23 07/28/2006  . Td 08/09/2007  . Tdap 11/30/2011  . Zoster 03/05/2006  . Zoster Recombinat (Shingrix) 06/29/2019    Health Maintenance  Topic Date Due  . 03/07/2006  11/29/2021  . DEXA SCAN  09/15/2022  . INFLUENZA VACCINE  Completed  . PNA vac Low Risk Adult  Completed     Discussed health benefits of physical activity, and encouraged her to engage in regular exercise appropriate for her age and condition.    --------------------------------------------------------------------  1. Annual physical exam   2. Essential (primary) hypertension Uncontrolled change hctz to -  triamterene-hydrochlorothiazide (DYAZIDE) 37.5-25 MG capsule;  Take 1 each (1 capsule total) by mouth daily.  Dispense: 30 capsule; Refill: 1  - Lipid panel - CBC - Comprehensive metabolic panel  Follow up BP check 2-3 months.   3. Hypothyroidism, unspecified type  - TSH    Lelon Huh, MD  [facility]

## 2019-09-13 ENCOUNTER — Telehealth: Payer: Self-pay

## 2019-09-13 LAB — LIPID PANEL
Chol/HDL Ratio: 2.3 ratio (ref 0.0–4.4)
Cholesterol, Total: 185 mg/dL (ref 100–199)
HDL: 81 mg/dL (ref >39)
LDL Chol Calc (NIH): 93 mg/dL (ref 0–99)
Triglycerides: 55 mg/dL (ref 0–149)
VLDL Cholesterol Cal: 11 mg/dL (ref 5–40)

## 2019-09-13 LAB — COMPREHENSIVE METABOLIC PANEL
ALT: 11 IU/L (ref 0–32)
AST: 25 IU/L (ref 0–40)
Albumin/Globulin Ratio: 2 (ref 1.2–2.2)
Albumin: 4.4 g/dL (ref 3.6–4.6)
Alkaline Phosphatase: 61 IU/L (ref 39–117)
BUN/Creatinine Ratio: 15 (ref 12–28)
BUN: 10 mg/dL (ref 8–27)
Bilirubin Total: 0.4 mg/dL (ref 0.0–1.2)
CO2: 25 mmol/L (ref 20–29)
Calcium: 9.6 mg/dL (ref 8.7–10.3)
Chloride: 91 mmol/L — ABNORMAL LOW (ref 96–106)
Creatinine, Ser: 0.67 mg/dL (ref 0.57–1.00)
GFR calc Af Amer: 91 mL/min/{1.73_m2} (ref >59)
GFR calc non Af Amer: 79 mL/min/{1.73_m2} (ref >59)
Globulin, Total: 2.2 g/dL (ref 1.5–4.5)
Glucose: 79 mg/dL (ref 65–99)
Potassium: 4 mmol/L (ref 3.5–5.2)
Sodium: 131 mmol/L — ABNORMAL LOW (ref 134–144)
Total Protein: 6.6 g/dL (ref 6.0–8.5)

## 2019-09-13 LAB — CBC
Hematocrit: 39 % (ref 34.0–46.6)
Hemoglobin: 12.9 g/dL (ref 11.1–15.9)
MCH: 28.1 pg (ref 26.6–33.0)
MCHC: 33.1 g/dL (ref 31.5–35.7)
MCV: 85 fL (ref 79–97)
Platelets: 364 10*3/uL (ref 150–450)
RBC: 4.59 x10E6/uL (ref 3.77–5.28)
RDW: 12.8 % (ref 11.7–15.4)
WBC: 7.4 10*3/uL (ref 3.4–10.8)

## 2019-09-13 LAB — TSH: TSH: 2.18 u[IU]/mL (ref 0.450–4.500)

## 2019-09-13 NOTE — Telephone Encounter (Signed)
Patient advised of lab results

## 2019-09-13 NOTE — Telephone Encounter (Signed)
-----   Message from [name], MD sent at 09/13/2019  8:01 AM EST ----- Labs are very good. Check yearly.

## 2019-10-23 NOTE — Patient Instructions (Signed)
.   Please review the attached list of medications and notify my office if there are any errors.   . Please bring all of your medications to every appointment so we can make sure that our medication list is the same as yours.   

## 2019-10-23 NOTE — Progress Notes (Signed)
Established patient visit      Patient: Isabel Myers   DOB: 1929/10/06   86 y.o. Female  MRN: [id number] Visit Date: 10/24/2019  Today's healthcare provider: Mila Merry, MD  Subjective:    Chief Complaint  Patient presents with  . Hypertension  I, Martyn Ehrich, CMA, have reviewed all documentation for this visit. The documentation on 10/24/19 for the exam, diagnosis, procedures, and orders are all accurate and complete.  HPI Hypertension, follow-up  BP Readings from Last 3 Encounters:  10/24/19 130/66  09/12/19 (!) 170/62  05/22/19 (!) 180/75   She was last seen for hypertension 6 weeks ago.  BP at that visit was 170/62. Management since that visit includes changing hctz to - triamterene-hydrochlorothiazide 37.5-25 MG daily . She reports good compliance with treatment. She is not having side effects.  She is exercising. She is adherent to low salt diet.   Outside blood pressures are not being checked Symptoms: No chest pain No chest pressure/discomfort No dyspnea No lower extremity edema No orthopnea No palpitations No paroxysmal nocturnal dyspnea No syncope  She does not smoke. She is following a Regular diet. Use of agents associated with hypertension: thyroid hormones.   Last metabolic panel Lab Results  Component Value Date   GLUCOSE 79 09/12/2019   NA 131 (L) 09/12/2019   K 4.0 09/12/2019   CL 91 (L) 09/12/2019   CO2 25 09/12/2019   BUN 10 09/12/2019   CREATININE 0.67 09/12/2019   GFRNONAA 79 09/12/2019   GFRAA 91 09/12/2019   CALCIUM 9.6 09/12/2019   PHOS 3.1 09/05/2018   PROT 6.6 09/12/2019   ALBUMIN 4.4 09/12/2019   LABGLOB 2.2 09/12/2019   AGRATIO 2.0 09/12/2019   BILITOT 0.4 09/12/2019   ALKPHOS 61 09/12/2019   AST 25 09/12/2019   ALT 11 09/12/2019   ANIONGAP 9 05/22/2019   Last lipids Lab Results  Component Value Date   CHOL 185 09/12/2019   HDL 81 09/12/2019   LDLCALC 93 09/12/2019   TRIG 55 09/12/2019   CHOLHDL 2.3  09/12/2019    The ASCVD Risk score Denman George DC Jr., et al., 2013) failed to calculate for the following reasons:   The 2013 ASCVD risk score is only valid for ages 37 to 83   ----------------------------------------------------------------------------------------------------------------------     Medications: Outpatient Medications Prior to Visit  Medication Sig  . Calcium Carbonate (CALCIUM 600 PO) Take 1 tablet by mouth daily.  Marland Kitchen levothyroxine (SYNTHROID) 88 MCG tablet TAKE 1 TABLET BY MOUTH DAILY  . triamterene-hydrochlorothiazide (DYAZIDE) 37.5-25 MG capsule Take 1 each (1 capsule total) by mouth daily.  . triazolam (HALCION) 0.25 MG tablet Take 0.5-1 tablets (0.125-0.25 mg total) by mouth at bedtime as needed.   No facility-administered medications prior to visit.    Review of Systems  Constitutional: Negative for appetite change, chills, fatigue and fever.  Respiratory: Negative for chest tightness and shortness of breath.   Cardiovascular: Negative for chest pain and palpitations.  Gastrointestinal: Negative for abdominal pain, nausea and vomiting.  Neurological: Negative for dizziness and weakness.        Objective:    BP 130/66 (BP Location: Right Arm, Patient Position: Sitting, Cuff Size: Normal)   Pulse 72   Temp (!) 97.1 F (36.2 C) (Temporal)   Resp 16   Wt 114 lb 9.6 oz (52 kg)   BMI 21.65 kg/m    Physical Exam General: Appearance:    Well developed, well nourished female in no acute distress  Eyes:    PERRL, conjunctiva/corneas clear, EOM's intact       Lungs:     Clear to auscultation bilaterally, respirations unlabored  Heart:    Normal heart rate. Normal rhythm. No murmurs, rubs, or gallops.   MS:   All extremities are intact.   Neurologic:   Awake, alert, oriented x 3. No apparent focal neurological           defect.       No results found for any visits on 10/24/19.    Assessment & Plan:    1. Essential (primary) hypertension Much improved with  change from hctz to triamterene/hctz. Continue medications. Check renal panel. Will call with results and schedule a follow up appointment.   - Renal Function Panel   The entirety of the information documented in the History of Present Illness, Review of Systems and Physical Exam were personally obtained by me. Portions of this information were initially documented by Idelle Jo, CMA and reviewed by me for thoroughness and accuracy.   Lelon Huh, MD  [facility] [phone] (phone) [phone] (fax)  [place]

## 2019-10-24 ENCOUNTER — Other Ambulatory Visit: Payer: Self-pay

## 2019-10-24 ENCOUNTER — Ambulatory Visit (INDEPENDENT_AMBULATORY_CARE_PROVIDER_SITE_OTHER): Payer: Medicare Other | Admitting: Family Medicine

## 2019-10-24 ENCOUNTER — Encounter: Payer: Self-pay | Admitting: Family Medicine

## 2019-10-24 DIAGNOSIS — I10 Essential (primary) hypertension: Secondary | ICD-10-CM | POA: Diagnosis not present

## 2019-10-25 ENCOUNTER — Other Ambulatory Visit: Payer: Self-pay | Admitting: Family Medicine

## 2019-10-25 ENCOUNTER — Telehealth: Payer: Self-pay

## 2019-10-25 DIAGNOSIS — I10 Essential (primary) hypertension: Secondary | ICD-10-CM

## 2019-10-25 LAB — RENAL FUNCTION PANEL
Albumin: 4.5 g/dL (ref 3.6–4.6)
BUN/Creatinine Ratio: 13 (ref 12–28)
BUN: 11 mg/dL (ref 8–27)
CO2: 24 mmol/L (ref 20–29)
Calcium: 9.8 mg/dL (ref 8.7–10.3)
Chloride: 95 mmol/L — ABNORMAL LOW (ref 96–106)
Creatinine, Ser: 0.83 mg/dL (ref 0.57–1.00)
GFR calc Af Amer: 73 mL/min/{1.73_m2} (ref >59)
GFR calc non Af Amer: 64 mL/min/{1.73_m2} (ref >59)
Glucose: 48 mg/dL — ABNORMAL LOW (ref 65–99)
Phosphorus: 3.2 mg/dL (ref 3.0–4.3)
Potassium: 4.3 mmol/L (ref 3.5–5.2)
Sodium: 136 mmol/L (ref 134–144)

## 2019-10-25 MED ORDER — TRIAMTERENE-HCTZ 37.5-25 MG PO CAPS: 1.0000 | ORAL_CAPSULE | Freq: Every day | ORAL | 5 refills | Status: DC

## 2019-10-25 NOTE — Telephone Encounter (Signed)
Attempted to contact patient, no answer or voicemail. Okay for PEC to advise patient and schedule follow up.

## 2019-10-25 NOTE — Telephone Encounter (Signed)
Result note read to patient, verbalizes understanding. Offered to schedule F/U appt, prefers to CB closer to appt due.

## 2019-10-25 NOTE — Telephone Encounter (Signed)
-----   Message from [name], MD sent at 10/25/2019  7:49 AM EDT ----- Labs good. Continue same dose of BP medications. Have sent refills to her pharmacy. Schedule follow up for BP check in 5-6 months.

## 2020-04-19 ENCOUNTER — Other Ambulatory Visit: Payer: Self-pay | Admitting: Family Medicine

## 2020-04-19 DIAGNOSIS — I10 Essential (primary) hypertension: Secondary | ICD-10-CM

## 2020-05-15 ENCOUNTER — Telehealth: Payer: Self-pay

## 2020-05-15 NOTE — Telephone Encounter (Signed)
Copied from CRM [id number]. Topic: General - Other >> May 15, 2020  1:07 PM Herby Abraham C wrote: Reason for CRM: pt says that she received a call from [facility] stating that they are calling about a request for a back brace. Pt says that she didn't request a back brace and would like to make sure that PCP didn't order on her behalf?     CB: [phone]--please assist.

## 2020-06-13 NOTE — Progress Notes (Deleted)
Subjective:   Isabel Myers is a 86 y.o. female who presents for Medicare Annual (Subsequent) preventive examination.  I connected with Onalee Hua today by telephone and verified that I am speaking with the correct person using two identifiers. Location patient: home Location provider: work Persons participating in the virtual visit: patient, provider.   I discussed the limitations, risks, security and privacy concerns of performing an evaluation and management service by telephone and the availability of in person appointments. I also discussed with the patient that there may be a patient responsible charge related to this service. The patient expressed understanding and verbally consented to this telephonic visit.    Interactive audio and video telecommunications were attempted between this provider and patient, however failed, due to patient having technical difficulties OR patient did not have access to video capability.  We continued and completed visit with audio only.   Review of Systems    N/A        Objective:    There were no vitals filed for this visit. There is no height or weight on file to calculate BMI.  Advanced Directives 06/14/2019 05/22/2019 06/02/2018 05/31/2017  Does Patient Have a Medical Advance Directive? Yes Yes Yes Yes  Type of Estate agent of [place];Living will Living will Living will;Healthcare Power of [employer] Power of [place];Living will  Copy of Healthcare Power of Attorney in Chart? No - copy requested - No - copy requested No - copy requested  Would patient like information on creating a medical advance directive? - No - Patient declined - -    Current Medications (verified) Outpatient Encounter Medications as of 06/17/2020  Medication Sig  . Calcium Carbonate (CALCIUM 600 PO) Take 1 tablet by mouth daily.  Marland Kitchen levothyroxine (SYNTHROID) 88 MCG tablet TAKE 1 TABLET BY MOUTH DAILY  .  triamterene-hydrochlorothiazide (DYAZIDE) 37.5-25 MG capsule TAKE 1 CAPSULE BY MOUTH ONCE DAILY  . triazolam (HALCION) 0.25 MG tablet Take 0.5-1 tablets (0.125-0.25 mg total) by mouth at bedtime as needed.   No facility-administered encounter medications on file as of 06/17/2020.    Allergies (verified) Amlodipine   History: Past Medical History:  Diagnosis Date  . Hypertension   . Thyroid disease    hypothyroid   Past Surgical History:  Procedure Laterality Date  . ABDOMINAL HYSTERECTOMY  1987   with BSO  . Bladder tack  2009   Dr. Chrissie Noa  . CT Cervical Spine  10/22/2011   negative  . CT Scan of head  10/22/2011   without contrast, [facility], Normal  . THYROIDECTOMY  1937   SUBTOTAL  . TONSILLECTOMY AND ADENOIDECTOMY     Family History  Problem Relation Age of Onset  . Memory loss Sister        short term memory loss   Social History   Socioeconomic History  . Marital status: Married    Spouse name: Not on file  . Number of children: 2  . Years of education: Not on file  . Highest education level: Master's degree (e.g., MA, MS, MEng, MEd, MSW, MBA)  Occupational History  . Occupation: Retired  Tobacco Use  . Smoking status: Former Smoker    Packs/day: 0.50    Years: 15.00    Pack years: 7.50    Types: Cigarettes  . Smokeless tobacco: Never Used  . Tobacco comment: Quit in the 1970s  Vaping Use  . Vaping Use: Never used  Substance and Sexual Activity  . Alcohol use: Yes  Alcohol/week: 7.0 standard drinks    Types: 7 Glasses of wine per week  . Drug use: No  . Sexual activity: Not on file  Other Topics Concern  . Not on file  Social History Narrative  . Not on file   Social Determinants of Health   Financial Resource Strain:   . Difficulty of Paying Living Expenses: Not on file  Food Insecurity:   . Worried About Programme researcher, broadcasting/film/video in the Last Year: Not on file  . Ran Out of Food in the Last Year: Not on file  Transportation Needs:   . Lack  of Transportation (Medical): Not on file  . Lack of Transportation (Non-Medical): Not on file  Physical Activity:   . Days of Exercise per Week: Not on file  . Minutes of Exercise per Session: Not on file  Stress: No Stress Concern Present  . Feeling of Stress : Not at all  Social Connections:   . Frequency of Communication with Friends and Family: Not on file  . Frequency of Social Gatherings with Friends and Family: Not on file  . Attends Religious Services: Not on file  . Active Member of Clubs or Organizations: Not on file  . Attends Banker Meetings: Not on file  . Marital Status: Not on file    Tobacco Counseling Counseling given: Not Answered Comment: Quit in the 1970s   Clinical Intake:                 Diabetic? No         Activities of Daily Living In your present state of health, do you have any difficulty performing the following activities: 06/14/2019  Hearing? Y  Comment Wears bilateral hearing aids.  Vision? N  Difficulty concentrating or making decisions? N  Walking or climbing stairs? N  Dressing or bathing? N  Doing errands, shopping? N  Preparing Food and eating ? N  Using the Toilet? N  In the past six months, have you accidently leaked urine? N  Do you have problems with loss of bowel control? N  Managing your Medications? N  Managing your Finances? N  Housekeeping or managing your Housekeeping? N  Some recent data might be hidden    Patient Care Team: [name], MD as PCP - General (Family Medicine) Dingeldein, Viviann Spare, MD (Ophthalmology) Fransico Michael, MD as Referring Physician (Audiology)  Indicate any recent Medical Services you may have received from other than [facility] providers in the past year (date may be approximate).     Assessment:   This is a routine wellness examination for Ree.  Hearing/Vision screen No exam data present  Dietary issues and exercise activities discussed:    Goals     . DIET - WATER INTAKE     Continue drinking 6-8 glasses of water a day.       Depression Screen PHQ 2/9 Scores 06/14/2019 06/02/2018 06/02/2018 05/31/2017 05/31/2017 03/03/2016  PHQ - 2 Score 0 0 0 0 0 0  PHQ- 9 Score - 0 - 0 - -    Fall Risk Fall Risk  06/14/2019 06/02/2018 05/31/2017 03/03/2016  Falls in the past year? 0 0 No No  Number falls in past yr: 0 - - -  Injury with Fall? 0 - - -    Any stairs in or around the home? {YES/NO:21197} If so, are there any without handrails? {YES/NO:21197} Home free of loose throw rugs in walkways, pet beds, electrical cords, etc? Yes  Adequate lighting in your home to reduce risk of falls? Yes   ASSISTIVE DEVICES UTILIZED TO PREVENT FALLS:  Life alert? {YES/NO:21197} Use of a cane, walker or w/c? {YES/NO:21197} Grab bars in the bathroom? {YES/NO:21197} Shower chair or bench in shower? {YES/NO:21197} Elevated toilet seat or a handicapped toilet? {YES/NO:21197}   Cognitive Function:     6CIT Screen 06/02/2018 05/31/2017  What Year? 0 points 0 points  What month? 0 points -  What time? 0 points 0 points  Count back from 20 0 points 0 points  Months in reverse 0 points 0 points  Repeat phrase 0 points 0 points  Total Score 0 -    Immunizations Immunization History  Administered Date(s) Administered  . DTaP 07/28/1993  . Influenza-Unspecified 04/12/2016, 04/23/2018  . Moderna SARS-COVID-2 Vaccination 07/25/2019, 08/23/2019  . Pneumococcal Conjugate-13 04/06/2014  . Pneumococcal Polysaccharide-23 07/28/2006  . Td 08/09/2007  . Tdap 11/30/2011  . Zoster 03/05/2006  . Zoster Recombinat (Shingrix) 06/29/2019    TDAP status: Up to date {Flu Vaccine status:2101806} Pneumococcal vaccine status: Up to date Covid-19 vaccine status: Completed vaccines  Qualifies for Shingles Vaccine? Yes   Zostavax completed Yes   Shingrix Completed?: Yes  Screening Tests Health Maintenance  Topic Date Due  . INFLUENZA VACCINE  02/11/2020  .  TETANUS/TDAP  11/29/2021  . DEXA SCAN  09/15/2022  . COVID-19 Vaccine  Completed  . PNA vac Low Risk Adult  Completed    Health Maintenance  Health Maintenance Due  Topic Date Due  . INFLUENZA VACCINE  02/11/2020    Colorectal cancer screening: No longer required.  Mammogram status: No longer required.  Bone Density status: Completed 09/14/17. Results reflect: Bone density results: OSTEOPENIA. Repeat every 5 years.  Lung Cancer Screening: (Low Dose CT Chest recommended if Age 25-80 years, 30 pack-year currently smoking OR have quit w/in 15years.) does not qualify.    Additional Screening:  Vision Screening: Recommended annual ophthalmology exams for early detection of glaucoma and other disorders of the eye. Is the patient up to date with their annual eye exam?  Yes  Who is the provider or what is the name of the office in which the patient attends annual eye exams? *** If pt is not established with a provider, would they like to be referred to a provider to establish care? No .   Dental Screening: Recommended annual dental exams for proper oral hygiene  Community Resource Referral / Chronic Care Management: CRR required this visit?  No   CCM required this visit?  No      Plan:     I have personally reviewed and noted the following in the patient's chart:   . Medical and social history . Use of alcohol, tobacco or illicit drugs  . Current medications and supplements . Functional ability and status . Nutritional status . Physical activity . Advanced directives . List of other physicians . Hospitalizations, surgeries, and ER visits in previous 12 months . Vitals . Screenings to include cognitive, depression, and falls . Referrals and appointments  In addition, I have reviewed and discussed with patient certain preventive protocols, quality metrics, and best practice recommendations. A written personalized care plan for preventive services as well as general  preventive health recommendations were provided to patient.     Estrellita Lasky [place], [place]   [id number]   Nurse Notes: ***

## 2020-06-24 NOTE — Progress Notes (Signed)
Subjective:   Isabel Myers is a 86 y.o. female who presents for Medicare Annual (Subsequent) preventive examination.  I connected with Isabel Myers today by telephone and verified that I am speaking with the correct person using two identifiers. Location patient: home Location provider: work Persons participating in the virtual visit: patient, provider.   I discussed the limitations, risks, security and privacy concerns of performing an evaluation and management service by telephone and the availability of in person appointments. I also discussed with the patient that there may be a patient responsible charge related to this service. The patient expressed understanding and verbally consented to this telephonic visit.    Interactive audio and video telecommunications were attempted between this provider and patient, however failed, due to patient having technical difficulties OR patient did not have access to video capability.  We continued and completed visit with audio only.   Review of Systems    N/A  Cardiac Risk Factors include: advanced age (>106men, >56 women);hypertension     Objective:    There were no vitals filed for this visit. There is no height or weight on file to calculate BMI.  Advanced Directives 06/25/2020 06/14/2019 05/22/2019 06/02/2018 05/31/2017  Does Patient Have a Medical Advance Directive? Yes Yes Yes Yes Yes  Type of Estate agent of [place];Living will Healthcare Power of [place];Living will Living will Living will;Healthcare Power of [employer] Power of [place];Living will  Copy of Healthcare Power of Attorney in Chart? No - copy requested No - copy requested - No - copy requested No - copy requested  Would patient like information on creating a medical advance directive? - - No - Patient declined - -    Current Medications (verified) Outpatient Encounter Medications as of 06/25/2020  Medication Sig  . Calcium  Carbonate (CALCIUM 600 PO) Take 1 tablet by mouth daily.  Marland Kitchen levothyroxine (SYNTHROID) 88 MCG tablet TAKE 1 TABLET BY MOUTH DAILY  . Multiple Vitamins-Minerals (PRESERVISION AREDS 2 PO) Take 2 tablets by mouth daily at 6 (six) AM.  . triamterene-hydrochlorothiazide (DYAZIDE) 37.5-25 MG capsule TAKE 1 CAPSULE BY MOUTH ONCE DAILY  . triazolam (HALCION) 0.25 MG tablet Take 0.5-1 tablets (0.125-0.25 mg total) by mouth at bedtime as needed.   No facility-administered encounter medications on file as of 06/25/2020.    Allergies (verified) Amlodipine   History: Past Medical History:  Diagnosis Date  . Hypertension   . Thyroid disease    hypothyroid   Past Surgical History:  Procedure Laterality Date  . ABDOMINAL HYSTERECTOMY  1987   with BSO  . Bladder tack  2009   Dr. Einar Grad    . CATARACT EXTRACTION, BILATERAL    . CT Cervical Spine  10/22/2011   negative  . CT Scan of head  10/22/2011   without contrast, [facility], Normal  . THYROIDECTOMY  1937   SUBTOTAL  . TONSILLECTOMY AND ADENOIDECTOMY     Family History  Problem Relation Age of Onset  . Memory loss Sister        short term memory loss   Social History   Socioeconomic History  . Marital status: Widowed    Spouse name: Not on file  . Number of children: 2  . Years of education: Not on file  . Highest education level: Master's degree (e.g., MA, MS, MEng, MEd, MSW, MBA)  Occupational History  . Occupation: Retired  Tobacco Use  . Smoking status: Former Smoker    Packs/day: 0.50  Years: 15.00    Pack years: 7.50    Types: Cigarettes  . Smokeless tobacco: Never Used  . Tobacco comment: Quit in the 1970s  Vaping Use  . Vaping Use: Never used  Substance and Sexual Activity  . Alcohol use: Yes    Alcohol/week: 7.0 standard drinks    Types: 7 Glasses of wine per week    Comment: 1 glass a day  . Drug use: No  . Sexual activity: Not on file  Other Topics Concern  . Not on file  Social  History Narrative  . Not on file   Social Determinants of Health   Financial Resource Strain: Low Risk   . Difficulty of Paying Living Expenses: Not hard at all  Food Insecurity: No Food Insecurity  . Worried About Programme researcher, broadcasting/film/videounning Out of Food in the Last Year: Never true  . Ran Out of Food in the Last Year: Never true  Transportation Needs: No Transportation Needs  . Lack of Transportation (Medical): No  . Lack of Transportation (Non-Medical): No  Physical Activity: Insufficiently Active  . Days of Exercise per Week: 7 days  . Minutes of Exercise per Session: 20 min  Stress: No Stress Concern Present  . Feeling of Stress : Only a little  Social Connections: Moderately Integrated  . Frequency of Communication with Friends and Family: More than three times a week  . Frequency of Social Gatherings with Friends and Family: More than three times a week  . Attends Religious Services: More than 4 times per year  . Active Member of Clubs or Organizations: Yes  . Attends BankerClub or Organization Meetings: More than 4 times per year  . Marital Status: Widowed    Tobacco Counseling Counseling given: Not Answered Comment: Quit in the 1970s   Clinical Intake:  Pre-visit preparation completed: Yes  Pain : No/denies pain     Diabetes: No  How often do you need to have someone help you when you read instructions, pamphlets, or other written materials from your doctor or pharmacy?: 1 - Never  Diabetic? No  Interpreter Needed?: No  Information entered by :: [facility] CenterMmarkoski, LPN   Activities of Daily Living In your present state of health, do you have any difficulty performing the following activities: 06/25/2020  Hearing? N  Comment Wears bilateral hearing aids.  Vision? N  Difficulty concentrating or making decisions? N  Walking or climbing stairs? N  Dressing or bathing? N  Doing errands, shopping? N  Preparing Food and eating ? N  Using the Toilet? N  In the past six months, have you  accidently leaked urine? N  Do you have problems with loss of bowel control? Y  Comment Rarely  Managing your Medications? N  Managing your Finances? N  Housekeeping or managing your Housekeeping? Y  Comment Has assistance with heavy duty cleaning.  Some recent data might be hidden    Patient Care Team: Malva LimesFisher, Donald E, MD as PCP - General (Family Medicine) Dingeldein, Viviann SpareSteven, MD (Ophthalmology) Fransico MichaelPelletier, Sarah C, MD as Referring Physician (Audiology)  Indicate any recent Medical Services you may have received from other than [facility] providers in the past year (date may be approximate).     Assessment:   This is a routine wellness examination for Isabel DeistKathryn.  Hearing/Vision screen No exam data present  Dietary issues and exercise activities discussed: Current Exercise Habits: Home exercise routine, Type of exercise: walking, Time (Minutes): 20, Frequency (Times/Week): 7, Weekly Exercise (Minutes/Week): 140, Intensity: Mild, Exercise limited by:  None identified  Goals    . DIET - WATER INTAKE     Continue drinking 6-8 glasses of water a day.       Depression Screen PHQ 2/9 Scores 06/25/2020 06/14/2019 06/02/2018 06/02/2018 05/31/2017 05/31/2017 03/03/2016  PHQ - 2 Score 0 0 0 0 0 0 0  PHQ- 9 Score - - 0 - 0 - -    Fall Risk Fall Risk  06/25/2020 06/14/2019 06/02/2018 05/31/2017 03/03/2016  Falls in the past year? 0 0 0 No No  Number falls in past yr: 0 0 - - -  Injury with Fall? 0 0 - - -    FALL RISK PREVENTION PERTAINING TO THE HOME:  Any stairs in or around the home? No  If so, are there any without handrails? No  Home free of loose throw rugs in walkways, pet beds, electrical cords, etc? Yes  Adequate lighting in your home to reduce risk of falls? Yes   ASSISTIVE DEVICES UTILIZED TO PREVENT FALLS:  Life alert? Yes  Use of a cane, walker or w/c? Yes  Grab bars in the bathroom? Yes  Shower chair or bench in shower? No  Elevated toilet seat or a handicapped toilet?  No    Cognitive Function: Normal cognitive status assessed by observation by this Nurse Health Advisor. No abnormalities found.       6CIT Screen 06/02/2018 05/31/2017  What Year? 0 points 0 points  What month? 0 points -  What time? 0 points 0 points  Count back from 20 0 points 0 points  Months in reverse 0 points 0 points  Repeat phrase 0 points 0 points  Total Score 0 -    Immunizations Immunization History  Administered Date(s) Administered  . DTaP 07/28/1993  . Influenza, High Dose Seasonal PF 04/12/2020  . Influenza-Unspecified 04/12/2016, 04/23/2018  . Moderna Sars-Covid-2 Vaccination 07/25/2019, 08/23/2019, 05/23/2020  . Pneumococcal Conjugate-13 04/06/2014  . Pneumococcal Polysaccharide-23 07/28/2006  . Td 08/09/2007  . Tdap 11/30/2011  . Zoster 03/05/2006  . Zoster Recombinat (Shingrix) 06/29/2019    TDAP status: Up to date  Flu Vaccine status: Up to date  Pneumococcal vaccine status: Up to date  Covid-19 vaccine status: Completed vaccines  Qualifies for Shingles Vaccine? Yes   Zostavax completed Yes   Shingrix Completed?: Yes  Screening Tests Health Maintenance  Topic Date Due  . TETANUS/TDAP  11/29/2021  . DEXA SCAN  09/15/2022  . INFLUENZA VACCINE  Completed  . COVID-19 Vaccine  Completed  . PNA vac Low Risk Adult  Completed    Health Maintenance  There are no preventive care reminders to display for this patient.  Colorectal cancer screening: No longer required.   Mammogram status: No longer required due to age.  Bone Density status: Completed 09/14/17. Results reflect: Bone density results: OSTEOPENIA. Repeat every 5 years.  Lung Cancer Screening: (Low Dose CT Chest recommended if Age 57-80 years, 30 pack-year currently smoking OR have quit w/in 15years.) does not qualify.    Additional Screening:  Vision Screening: Recommended annual ophthalmology exams for early detection of glaucoma and other disorders of the eye. Is the patient  up to date with their annual eye exam?  Yes  Who is the provider or what is the name of the office in which the patient attends annual eye exams? Dr Dingeldein @ [facility] If pt is not established with a provider, would they like to be referred to a provider to establish care? No .   Dental Screening: Recommended  annual dental exams for proper oral hygiene  Community Resource Referral / Chronic Care Management: CRR required this visit?  No   CCM required this visit?  No      Plan:     I have personally reviewed and noted the following in the patient's chart:   . Medical and social history . Use of alcohol, tobacco or illicit drugs  . Current medications and supplements . Functional ability and status . Nutritional status . Physical activity . Advanced directives . List of other physicians . Hospitalizations, surgeries, and ER visits in previous 12 months . Vitals . Screenings to include cognitive, depression, and falls . Referrals and appointments  In addition, I have reviewed and discussed with patient certain preventive protocols, quality metrics, and best practice recommendations. A written personalized care plan for preventive services as well as general preventive health recommendations were provided to patient.     Madalen Gavin [place], [place]   [id number]   Nurse Notes: None.

## 2020-06-25 ENCOUNTER — Other Ambulatory Visit: Payer: Self-pay

## 2020-06-25 ENCOUNTER — Ambulatory Visit (INDEPENDENT_AMBULATORY_CARE_PROVIDER_SITE_OTHER): Payer: Medicare Other

## 2020-06-25 DIAGNOSIS — Z0000 Encounter for general adult medical examination without abnormal findings: Secondary | ICD-10-CM | POA: Diagnosis not present

## 2020-06-25 NOTE — Patient Instructions (Addendum)
Isabel Myers , Thank you for taking time to come for your Medicare Wellness Visit. I appreciate your ongoing commitment to your health goals. Please review the following plan we discussed and let me know if I can assist you in the future.   Screening recommendations/referrals: Colonoscopy: No longer required.  Mammogram: No longer required.  Bone Density: Up to date, due 09/2022 Recommended yearly ophthalmology/optometry visit for glaucoma screening and checkup Recommended yearly dental visit for hygiene and checkup  Vaccinations: Influenza vaccine: Done 04/2020 Pneumococcal vaccine: Completed series Tdap vaccine: Up to date, due 11/2021 Shingles vaccine: Completed series    Advanced directives: Please bring a copy of your POA (Power of [place]) and/or Living Will to your next appointment.   Conditions/risks identified: Continue to increase water intake to 6-8 8 oz glasses a day.  Next appointment: 09/17/20 @ 9:00 AM with Dr Sherrie Mustache    Preventive Care 84 Years and Older, Female Preventive care refers to lifestyle choices and visits with your health care provider that can promote health and wellness. What does preventive care include?  A yearly physical exam. This is also called an annual well check.  Dental exams once or twice a year.  Routine eye exams. Ask your health care provider how often you should have your eyes checked.  Personal lifestyle choices, including:  Daily care of your teeth and gums.  Regular physical activity.  Eating a healthy diet.  Avoiding tobacco and drug use.  Limiting alcohol use.  Practicing safe sex.  Taking low-dose aspirin every day.  Taking vitamin and mineral supplements as recommended by your health care provider. What happens during an annual well check? The services and screenings done by your health care provider during your annual well check will depend on your age, overall health, lifestyle risk factors, and family history of  disease. Counseling  Your health care provider may ask you questions about your:  Alcohol use.  Tobacco use.  Drug use.  Emotional well-being.  Home and relationship well-being.  Sexual activity.  Eating habits.  History of falls.  Memory and ability to understand (cognition).  Work and work Astronomer.  Reproductive health. Screening  You may have the following tests or measurements:  Height, weight, and BMI.  Blood pressure.  Lipid and cholesterol levels. These may be checked every 5 years, or more frequently if you are over 84 years old.  Skin check.  Lung cancer screening. You may have this screening every year starting at age 84 if you have a 30-pack-year history of smoking and currently smoke or have quit within the past 15 years.  Fecal occult blood test (FOBT) of the stool. You may have this test every year starting at age 84.  Flexible sigmoidoscopy or colonoscopy. You may have a sigmoidoscopy every 5 years or a colonoscopy every 10 years starting at age 84.  Hepatitis C blood test.  Hepatitis B blood test.  Sexually transmitted disease (STD) testing.  Diabetes screening. This is done by checking your blood sugar (glucose) after you have not eaten for a while (fasting). You may have this done every 1-3 years.  Bone density scan. This is done to screen for osteoporosis. You may have this done starting at age 84.  Mammogram. This may be done every 1-2 years. Talk to your health care provider about how often you should have regular mammograms. Talk with your health care provider about your test results, treatment options, and if necessary, the need for more tests. Vaccines  Your  health care provider may recommend certain vaccines, such as:  Influenza vaccine. This is recommended every year.  Tetanus, diphtheria, and acellular pertussis (Tdap, Td) vaccine. You may need a Td booster every 10 years.  Zoster vaccine. You may need this after age  84.  Pneumococcal 13-valent conjugate (PCV13) vaccine. One dose is recommended after age 84.  Pneumococcal polysaccharide (PPSV23) vaccine. One dose is recommended after age 84. Talk to your health care provider about which screenings and vaccines you need and how often you need them. This information is not intended to replace advice given to you by your health care provider. Make sure you discuss any questions you have with your health care provider. Document Released: 07/26/2015 Document Revised: 03/18/2016 Document Reviewed: 04/30/2015 Elsevier Interactive Patient Education  2017 [place] Prevention in the Home Falls can cause injuries. They can happen to people of all ages. There are many things you can do to make your home safe and to help prevent falls. What can I do on the outside of my home?  Regularly fix the edges of walkways and driveways and fix any cracks.  Remove anything that might make you trip as you walk through a door, such as a raised step or threshold.  Trim any bushes or trees on the path to your home.  Use bright outdoor lighting.  Clear any walking paths of anything that might make someone trip, such as rocks or tools.  Regularly check to see if handrails are loose or broken. Make sure that both sides of any steps have handrails.  Any raised decks and porches should have guardrails on the edges.  Have any leaves, snow, or ice cleared regularly.  Use sand or salt on walking paths during winter.  Clean up any spills in your garage right away. This includes oil or grease spills. What can I do in the bathroom?  Use night lights.  Install grab bars by the toilet and in the tub and shower. Do not use towel bars as grab bars.  Use non-skid mats or decals in the tub or shower.  If you need to sit down in the shower, use a plastic, non-slip stool.  Keep the floor dry. Clean up any water that spills on the floor as soon as it happens.  Remove  soap buildup in the tub or shower regularly.  Attach bath mats securely with double-sided non-slip rug tape.  Do not have throw rugs and other things on the floor that can make you trip. What can I do in the bedroom?  Use night lights.  Make sure that you have a light by your bed that is easy to reach.  Do not use any sheets or blankets that are too big for your bed. They should not hang down onto the floor.  Have a firm chair that has side arms. You can use this for support while you get dressed.  Do not have throw rugs and other things on the floor that can make you trip. What can I do in the kitchen?  Clean up any spills right away.  Avoid walking on wet floors.  Keep items that you use a lot in easy-to-reach places.  If you need to reach something above you, use a strong step stool that has a grab bar.  Keep electrical cords out of the way.  Do not use floor polish or wax that makes floors slippery. If you must use wax, use non-skid floor wax.  Do not  have throw rugs and other things on the floor that can make you trip. What can I do with my stairs?  Do not leave any items on the stairs.  Make sure that there are handrails on both sides of the stairs and use them. Fix handrails that are broken or loose. Make sure that handrails are as long as the stairways.  Check any carpeting to make sure that it is firmly attached to the stairs. Fix any carpet that is loose or worn.  Avoid having throw rugs at the top or bottom of the stairs. If you do have throw rugs, attach them to the floor with carpet tape.  Make sure that you have a light switch at the top of the stairs and the bottom of the stairs. If you do not have them, ask someone to add them for you. What else can I do to help prevent falls?  Wear shoes that:  Do not have high heels.  Have rubber bottoms.  Are comfortable and fit you well.  Are closed at the toe. Do not wear sandals.  If you use a  stepladder:  Make sure that it is fully opened. Do not climb a closed stepladder.  Make sure that both sides of the stepladder are locked into place.  Ask someone to hold it for you, if possible.  Clearly mark and make sure that you can see:  Any grab bars or handrails.  First and last steps.  Where the edge of each step is.  Use tools that help you move around (mobility aids) if they are needed. These include:  Canes.  Walkers.  Scooters.  Crutches.  Turn on the lights when you go into a dark area. Replace any light bulbs as soon as they burn out.  Set up your furniture so you have a clear path. Avoid moving your furniture around.  If any of your floors are uneven, fix them.  If there are any pets around you, be aware of where they are.  Review your medicines with your doctor. Some medicines can make you feel dizzy. This can increase your chance of falling. Ask your doctor what other things that you can do to help prevent falls. This information is not intended to replace advice given to you by your health care provider. Make sure you discuss any questions you have with your health care provider. Document Released: 04/25/2009 Document Revised: 12/05/2015 Document Reviewed: 08/03/2014 Elsevier Interactive Patient Education  2017 [employer].

## 2020-08-12 ENCOUNTER — Other Ambulatory Visit: Payer: Self-pay | Admitting: Family Medicine

## 2020-09-17 ENCOUNTER — Ambulatory Visit (INDEPENDENT_AMBULATORY_CARE_PROVIDER_SITE_OTHER): Payer: Medicare Other | Admitting: Family Medicine

## 2020-09-17 ENCOUNTER — Other Ambulatory Visit: Payer: Self-pay

## 2020-09-17 VITALS — BP 150/60 | HR 76 | Temp 97.3°F | Ht 60.0 in | Wt 117.0 lb

## 2020-09-17 DIAGNOSIS — Z0000 Encounter for general adult medical examination without abnormal findings: Secondary | ICD-10-CM

## 2020-09-17 DIAGNOSIS — I10 Essential (primary) hypertension: Secondary | ICD-10-CM | POA: Diagnosis not present

## 2020-09-17 DIAGNOSIS — E039 Hypothyroidism, unspecified: Secondary | ICD-10-CM | POA: Diagnosis not present

## 2020-09-17 NOTE — Progress Notes (Signed)
Complete physical exam   Patient: Isabel Myers   DOB: February 06, 1930   87 y.o. Female  MRN: [id number] Visit Date: 09/17/2020  Today's healthcare provider: Mila Merry, MD   Chief Complaint  Patient presents with  . Annual Exam   Subjective    Isabel Myers is a 87 y.o. female who presents today for a complete physical exam.  She reports consuming a general diet. Home exercise routine includes walking three 20 minute walks per day hrs per day. She generally feels well. She reports sleeping well. She does not have additional problems to discuss today.  HPI  She is also due for follow up hypertension and hypothyroid. She is doing well with triamterene-hctz. She was having a lot of swelling when she was taking amlodipine which has resolved.   Past Medical History:  Diagnosis Date  . Hypertension   . Thyroid disease    hypothyroid   Past Surgical History:  Procedure Laterality Date  . ABDOMINAL HYSTERECTOMY  1987   with BSO  . Bladder tack  2009   Dr. Einar Grad    . CATARACT EXTRACTION, BILATERAL    . CT Cervical Spine  10/22/2011   negative  . CT Scan of head  10/22/2011   without contrast, [facility], Normal  . THYROIDECTOMY  1937   SUBTOTAL  . TONSILLECTOMY AND ADENOIDECTOMY     Social History   Socioeconomic History  . Marital status: Widowed    Spouse name: Not on file  . Number of children: 2  . Years of education: Not on file  . Highest education level: Master's degree (e.g., MA, MS, MEng, MEd, MSW, MBA)  Occupational History  . Occupation: Retired  Tobacco Use  . Smoking status: Former Smoker    Packs/day: 0.50    Years: 15.00    Pack years: 7.50    Types: Cigarettes  . Smokeless tobacco: Never Used  . Tobacco comment: Quit in the 1970s  Vaping Use  . Vaping Use: Never used  Substance and Sexual Activity  . Alcohol use: Yes    Alcohol/week: 7.0 standard drinks    Types: 7 Glasses of wine per week    Comment: 1 glass a day   . Drug use: No  . Sexual activity: Not on file  Other Topics Concern  . Not on file  Social History Narrative  . Not on file   Social Determinants of Health   Financial Resource Strain: Low Risk   . Difficulty of Paying Living Expenses: Not hard at all  Food Insecurity: No Food Insecurity  . Worried About Programme researcher, broadcasting/film/video in the Last Year: Never true  . Ran Out of Food in the Last Year: Never true  Transportation Needs: No Transportation Needs  . Lack of Transportation (Medical): No  . Lack of Transportation (Non-Medical): No  Physical Activity: Insufficiently Active  . Days of Exercise per Week: 7 days  . Minutes of Exercise per Session: 20 min  Stress: No Stress Concern Present  . Feeling of Stress : Only a little  Social Connections: Moderately Integrated  . Frequency of Communication with Friends and Family: More than three times a week  . Frequency of Social Gatherings with Friends and Family: More than three times a week  . Attends Religious Services: More than 4 times per year  . Active Member of Clubs or Organizations: Yes  . Attends Banker Meetings: More than 4 times per year  .  Marital Status: Widowed  Intimate Partner Violence: Not At Risk  . Fear of Current or Ex-Partner: No  . Emotionally Abused: No  . Physically Abused: No  . Sexually Abused: No   Family Status  Relation Name Status  . Sister  Alive  . Mother  Deceased at age 66  . Father  Deceased at age 72  . Brother  Alive  . Sister  Alive  . Brother  Alive       mentally challenged. In a group home in [place]  . Son  Alive   Family History  Problem Relation Age of Onset  . Memory loss Sister        short term memory loss   Allergies  Allergen Reactions  . Amlodipine     Too much swelling on 2.5mg  daily    Patient Care Team: [name], MD as PCP - General (Family Medicine) Dingeldein, Viviann Spare, MD (Ophthalmology) Fransico Michael, MD as Referring Physician  (Audiology)   Medications: Outpatient Medications Prior to Visit  Medication Sig  . Calcium Carbonate (CALCIUM 600 PO) Take 1 tablet by mouth daily.  Marland Kitchen levothyroxine (SYNTHROID) 88 MCG tablet Take 1 tablet (88 mcg total) by mouth daily.  . Multiple Vitamins-Minerals (PRESERVISION AREDS 2 PO) Take 2 tablets by mouth daily at 6 (six) AM.  . triamterene-hydrochlorothiazide (DYAZIDE) 37.5-25 MG capsule TAKE 1 CAPSULE BY MOUTH ONCE DAILY  . triazolam (HALCION) 0.25 MG tablet Take 0.5-1 tablets (0.125-0.25 mg total) by mouth at bedtime as needed.   No facility-administered medications prior to visit.    Review of Systems  Musculoskeletal: Positive for back pain.      Objective    BP (!) 150/60   Pulse 76   Temp (!) 97.3 F (36.3 C) (Temporal)   Ht 5' (1.524 m)   Wt 117 lb (53.1 kg)   SpO2 98%   BMI 22.85 kg/m    Physical Exam    General Appearance:    Well developed, well nourished female. Alert, cooperative, in no acute distress, appears stated age   Head:    Normocephalic, without obvious abnormality, atraumatic  Eyes:    PERRL, conjunctiva/corneas clear, EOM's intact, fundi    benign, both eyes  Ears:    Normal TM's and external ear canals, both ears  Neck:   Supple, symmetrical, trachea midline, no adenopathy;    thyroid:  no enlargement/tenderness/nodules; no carotid   bruit or JVD  Back:     Symmetric, no curvature, ROM normal, no CVA tenderness  Lungs:     Clear to auscultation bilaterally, respirations unlabored  Chest Wall:    No tenderness or deformity   Heart:    Normal heart rate. Normal rhythm. No murmurs, rubs, or gallops.   Breast Exam:    deferred  Abdomen:     Soft, non-tender, bowel sounds active all four quadrants,    no masses, no organomegaly  Pelvic:    not indicated; post-menopausal, no abnormal Pap smears in past  Extremities:   All extremities are intact. No cyanosis or edema  Pulses:   2+ and symmetric all extremities  Skin:   Skin color,  texture, turgor normal, no rashes or lesions  Lymph nodes:   Cervical, supraclavicular, and axillary nodes normal  Neurologic:   CNII-XII intact, normal strength, sensation and reflexes    throughout     Last depression screening scores PHQ 2/9 Scores 06/25/2020 06/14/2019 06/02/2018  PHQ - 2 Score 0 0 0  PHQ- 9 Score - - 0   Last fall risk screening Fall Risk  06/25/2020  Falls in the past year? 0  Number falls in past yr: 0  Injury with Fall? 0   Last Audit-C alcohol use screening Alcohol Use Disorder Test (AUDIT) 06/25/2020  1. How often do you have a drink containing alcohol? 4  2. How many drinks containing alcohol do you have on a typical day when you are drinking? 0  3. How often do you have six or more drinks on one occasion? 0  AUDIT-C Score 4  4. How often during the last year have you found that you were not able to stop drinking once you had started? 0  5. How often during the last year have you failed to do what was normally expected from you because of drinking? 0  6. How often during the last year have you needed a first drink in the morning to get yourself going after a heavy drinking session? 0  7. How often during the last year have you had a feeling of guilt of remorse after drinking? 0  8. How often during the last year have you been unable to remember what happened the night before because you had been drinking? 0  9. Have you or someone else been injured as a result of your drinking? 0  10. Has a relative or friend or a doctor or another health worker been concerned about your drinking or suggested you cut down? 0  Alcohol Use Disorder Identification Test Final Score (AUDIT) 4  Alcohol Brief Interventions/Follow-up AUDIT Score <7 follow-up not indicated   A score of 3 or more in women, and 4 or more in men indicates increased risk for alcohol abuse, EXCEPT if all of the points are from question 1   No results found for any visits on 09/17/20.  Assessment &  Plan    Routine Health Maintenance and Physical Exam  Exercise Activities and Dietary recommendations Goals    . DIET - WATER INTAKE     Continue drinking 6-8 glasses of water a day.        Immunization History  Administered Date(s) Administered  . DTaP 07/28/1993  . Influenza, High Dose Seasonal PF 04/12/2020  . Influenza-Unspecified 04/12/2016, 04/23/2018  . Moderna Sars-Covid-2 Vaccination 07/25/2019, 08/23/2019, 05/23/2020  . Pneumococcal Conjugate-13 04/06/2014  . Pneumococcal Polysaccharide-23 07/28/2006  . Td 08/09/2007  . Tdap 11/30/2011  . Zoster 03/05/2006  . Zoster Recombinat (Shingrix) 06/29/2019    Health Maintenance  Topic Date Due  . Janet Berlin  11/29/2021  . DEXA SCAN  09/15/2022  . INFLUENZA VACCINE  Completed  . COVID-19 Vaccine  Completed  . PNA vac Low Risk Adult  Completed  . HPV VACCINES  Aged Out    Discussed health benefits of physical activity, and encouraged her to engage in regular exercise appropriate for her age and condition.  1. Hypothyroidism, unspecified type  - TSH - T4, free  2. Essential (primary) hypertension Tolerating triamterene-hctz much better than amlodipine, bp not at goal. Consider increasing if labs are normal.   - Comprehensive metabolic panel - CBC  3. Annual physical exam            Mila Merry, MD  [facility] [phone] (phone) [phone] (fax)  [facility]

## 2020-09-18 ENCOUNTER — Telehealth: Payer: Self-pay

## 2020-09-18 DIAGNOSIS — I10 Essential (primary) hypertension: Secondary | ICD-10-CM

## 2020-09-18 LAB — COMPREHENSIVE METABOLIC PANEL
ALT: 12 IU/L (ref 0–32)
AST: 24 IU/L (ref 0–40)
Albumin/Globulin Ratio: 1.9 (ref 1.2–2.2)
Albumin: 4.5 g/dL (ref 3.6–4.6)
Alkaline Phosphatase: 67 IU/L (ref 44–121)
BUN/Creatinine Ratio: 18 (ref 12–28)
BUN: 14 mg/dL (ref 8–27)
Bilirubin Total: 0.6 mg/dL (ref 0.0–1.2)
CO2: 25 mmol/L (ref 20–29)
Calcium: 10 mg/dL (ref 8.7–10.3)
Chloride: 94 mmol/L — ABNORMAL LOW (ref 96–106)
Creatinine, Ser: 0.8 mg/dL (ref 0.57–1.00)
Globulin, Total: 2.4 g/dL (ref 1.5–4.5)
Glucose: 92 mg/dL (ref 65–99)
Potassium: 4.1 mmol/L (ref 3.5–5.2)
Sodium: 135 mmol/L (ref 134–144)
Total Protein: 6.9 g/dL (ref 6.0–8.5)
eGFR: 71 mL/min/{1.73_m2} (ref >59)

## 2020-09-18 LAB — CBC
Hematocrit: 40 % (ref 34.0–46.6)
Hemoglobin: 13.7 g/dL (ref 11.1–15.9)
MCH: 29.3 pg (ref 26.6–33.0)
MCHC: 34.3 g/dL (ref 31.5–35.7)
MCV: 86 fL (ref 79–97)
Platelets: 333 10*3/uL (ref 150–450)
RBC: 4.68 x10E6/uL (ref 3.77–5.28)
RDW: 12.6 % (ref 11.7–15.4)
WBC: 8.1 10*3/uL (ref 3.4–10.8)

## 2020-09-18 LAB — TSH: TSH: 1.34 u[IU]/mL (ref 0.450–4.500)

## 2020-09-18 LAB — T4, FREE: Free T4: 1.85 ng/dL — ABNORMAL HIGH (ref 0.82–1.77)

## 2020-09-18 MED ORDER — LEVOTHYROXINE SODIUM 75 MCG PO TABS: 75.0000 ug | ORAL_TABLET | Freq: Every day | ORAL | 2 refills | Status: DC

## 2020-09-18 MED ORDER — TRIAMTERENE-HCTZ 75-50 MG PO TABS: 1.0000 | ORAL_TABLET | Freq: Every day | ORAL | 1 refills | Status: DC

## 2020-09-18 NOTE — Telephone Encounter (Signed)
-----   Message from [name], MD sent at 09/18/2020  7:49 AM EST ----- She is a little hyPER thyroid. Need to change levothyroxine to 75 mcg once a day. Rest of labs are good.  Need to change triamterene-hctz to 75/50  once a day for better blood pressure control (is she still has the 37.5/50 mg dose she can finish them by taking two a day) Please send in prescription for #30, rf x 1 and schedule follow up blood pressure 4-5 weeks.

## 2020-09-18 NOTE — Telephone Encounter (Signed)
Patient advised as below. She agrees to medication changes. Prescriptions sent into the pharmacy. Follow up appointment scheduled for 10/21/2020 at 9:40am.

## 2020-10-21 ENCOUNTER — Other Ambulatory Visit: Payer: Self-pay

## 2020-10-21 ENCOUNTER — Ambulatory Visit (INDEPENDENT_AMBULATORY_CARE_PROVIDER_SITE_OTHER): Payer: Medicare Other | Admitting: Family Medicine

## 2020-10-21 VITALS — BP 150/70 | HR 78 | Ht 61.0 in | Wt 110.8 lb

## 2020-10-21 DIAGNOSIS — I10 Essential (primary) hypertension: Secondary | ICD-10-CM

## 2020-10-21 NOTE — Progress Notes (Signed)
Established patient visit   Patient: Isabel Myers   DOB: [birth year]   87 y.o. Female  MRN: [id number] Visit Date: 10/21/2020  Today's healthcare provider: Mila Merry, MD   No chief complaint on file.  Subjective    HPI  Hypertension, follow-up  BP Readings from Last 3 Encounters:  09/17/20 (!) 150/60  10/24/19 130/66  09/12/19 (!) 170/62   Wt Readings from Last 3 Encounters:  09/17/20 117 lb (53.1 kg)  10/24/19 114 lb 9.6 oz (52 kg)  09/12/19 116 lb 3.2 oz (52.7 kg)     She was last seen for hypertension 1 months ago.  BP at that visit was 150/60. Management since that visit includes changing triamterene-hctz to 75/50 once a day for better blood pressure control (if she still has the 37.5/50 mg dose she can finish them by taking two a day).  She reports excellent compliance with treatment. She is not having side effects.  She is following a Regular diet. She is exercising. She does not smoke.  Use of agents associated with hypertension: none.   Outside blood pressures are n/a. Symptoms:  No chest pain No chest pressure  No palpitations No syncope  No dyspnea No orthopnea  No paroxysmal nocturnal dyspnea No lower extremity edema   Pertinent labs: Lab Results  Component Value Date   CHOL 185 09/12/2019   HDL 81 09/12/2019   LDLCALC 93 09/12/2019   TRIG 55 09/12/2019   CHOLHDL 2.3 09/12/2019   Lab Results  Component Value Date   NA 135 09/17/2020   K 4.1 09/17/2020   CREATININE 0.80 09/17/2020   GFRNONAA 64 10/24/2019   GFRAA 73 10/24/2019   GLUCOSE 92 09/17/2020     The ASCVD Risk score (Goff DC Jr., et al., 2013) failed to calculate for the following reasons:   The 2013 ASCVD risk score is only valid for ages 15 to 65   ---------------------------------------------------------------------------------------------------      Medications: Outpatient Medications Prior to Visit  Medication Sig  . Calcium Carbonate (CALCIUM 600 PO) Take 1  tablet by mouth daily.  Marland Kitchen levothyroxine (SYNTHROID) 75 MCG tablet Take 1 tablet (75 mcg total) by mouth daily.  . Multiple Vitamins-Minerals (PRESERVISION AREDS 2 PO) Take 2 tablets by mouth daily at 6 (six) AM.  . triamterene-hydrochlorothiazide (MAXZIDE) 75-50 MG tablet Take 1 tablet by mouth daily.  . triazolam (HALCION) 0.25 MG tablet Take 0.5-1 tablets (0.125-0.25 mg total) by mouth at bedtime as needed.   No facility-administered medications prior to visit.       Objective    BP (!) 150/70   Pulse 78   Ht 5\' 1"  (1.549 m)   Wt 110 lb 12.8 oz (50.3 kg)   SpO2 99%   BMI 20.94 kg/m     Physical Exam   General appearance: Well developed, well nourished female, cooperative and in no acute distress Head: Normocephalic, without obvious abnormality, atraumatic Respiratory: Respirations even and unlabored, normal respiratory rate Extremities: All extremities are intact.  Skin: Skin color, texture, turgor normal. No rashes seen  Psych: Appropriate mood and affect. Neurologic: Mental status: Alert, oriented to person, place, and time, thought content appropriate.    Assessment & Plan     1. Essential (primary) hypertension Is tolerating increased dose of triamterene/hctz, but BP not to goal. Is having considerable stress managing insurance and finances since her husband passed away which is likely contributing to high blood pressure. Continue current medication for now. Consider addition  amlodipine if not improved at follow up in 3-4 months.        The entirety of the information documented in the History of Present Illness, Review of Systems and Physical Exam were personally obtained by me. Portions of this information were initially documented by the CMA and reviewed by me for thoroughness and accuracy.      Mila Merry, MD  [facility] [phone] (phone) [phone] (fax)  [facility]

## 2020-10-21 NOTE — Patient Instructions (Signed)
.   Please review the attached list of medications and notify my office if there are any errors.   . Please bring all of your medications to every appointment so we can make sure that our medication list is the same as yours.   

## 2020-11-15 ENCOUNTER — Other Ambulatory Visit: Payer: Self-pay | Admitting: Family Medicine

## 2020-11-15 DIAGNOSIS — I10 Essential (primary) hypertension: Secondary | ICD-10-CM

## 2020-12-13 ENCOUNTER — Other Ambulatory Visit: Payer: Self-pay | Admitting: Family Medicine

## 2020-12-13 DIAGNOSIS — I10 Essential (primary) hypertension: Secondary | ICD-10-CM

## 2021-02-17 ENCOUNTER — Ambulatory Visit (INDEPENDENT_AMBULATORY_CARE_PROVIDER_SITE_OTHER): Payer: Medicare Other | Admitting: Family Medicine

## 2021-02-17 ENCOUNTER — Other Ambulatory Visit: Payer: Self-pay

## 2021-02-17 ENCOUNTER — Encounter: Payer: Self-pay | Admitting: Family Medicine

## 2021-02-17 VITALS — BP 153/59 | HR 81 | Resp 16 | Ht 61.0 in | Wt 108.0 lb

## 2021-02-17 DIAGNOSIS — I10 Essential (primary) hypertension: Secondary | ICD-10-CM

## 2021-02-17 MED ORDER — AMLODIPINE BESYLATE 2.5 MG PO TABS: 2.5000 mg | ORAL_TABLET | Freq: Every day | ORAL | 0 refills | Status: DC

## 2021-02-17 NOTE — Progress Notes (Signed)
Established patient visit   Patient: Isabel Myers   DOB: 05/29/1930   87 y.o. Female  MRN: [id number] Visit Date: 02/17/2021  Today's healthcare provider: Mila Merry, MD   Chief Complaint  Patient presents with   Hypertension   Subjective     Hypertension, follow-up  BP Readings from Last 3 Encounters:  02/17/21 (!) 153/59  10/21/20 (!) 150/70  09/17/20 (!) 150/60   Wt Readings from Last 3 Encounters:  02/17/21 108 lb (49 kg)  10/21/20 110 lb 12.8 oz (50.3 kg)  09/17/20 117 lb (53.1 kg)     She was last seen for hypertension 4 months ago.  BP at that visit was 150/70. Management since that visit includes no medication changes. Continue to monitor for now.   She reports good compliance with treatment. She is not having side effects.  She is following a Regular diet. She is not exercising. She does not smoke.  Use of agents associated with hypertension: none.   Outside blood pressures are checked occasionally. Symptoms: No chest pain No chest pressure  No palpitations No syncope  No dyspnea No orthopnea  No paroxysmal nocturnal dyspnea No lower extremity edema   Pertinent labs: Lab Results  Component Value Date   CHOL 185 09/12/2019   HDL 81 09/12/2019   LDLCALC 93 09/12/2019   TRIG 55 09/12/2019   CHOLHDL 2.3 09/12/2019   Lab Results  Component Value Date   NA 135 09/17/2020   K 4.1 09/17/2020   CREATININE 0.80 09/17/2020   GFRNONAA 64 10/24/2019   GFRAA 73 10/24/2019   GLUCOSE 92 09/17/2020     The ASCVD Risk score (Goff DC Jr., et al., 2013) failed to calculate for the following reasons:   The 2013 ASCVD risk score is only valid for ages 6 to 73      Medications: Outpatient Medications Prior to Visit  Medication Sig   Calcium Carbonate (CALCIUM 600 PO) Take 1 tablet by mouth daily.   levothyroxine (SYNTHROID) 75 MCG tablet TAKE 1 TABLET EVERY DAY ON EMPTY STOMACHWITH A GLASS OF WATER AT LEAST 30-60 MINBEFORE BREAKFAST   Multiple  Vitamins-Minerals (PRESERVISION AREDS 2 PO) Take 2 tablets by mouth daily at 6 (six) AM.   triamterene-hydrochlorothiazide (MAXZIDE) 75-50 MG tablet TAKE 1 TABLET BY MOUTH DAILY   triazolam (HALCION) 0.25 MG tablet Take 0.5-1 tablets (0.125-0.25 mg total) by mouth at bedtime as needed.   No facility-administered medications prior to visit.    Review of Systems  Constitutional:  Negative for activity change and fatigue.  Respiratory:  Negative for cough and shortness of breath.   Cardiovascular:  Negative for chest pain, palpitations and leg swelling.  Musculoskeletal:  Positive for arthralgias.  Neurological:  Negative for dizziness, light-headedness and headaches.      Objective    BP (!) 153/59   Pulse 81   Resp 16   Ht 5\' 1"  (1.549 m)   Wt 108 lb (49 kg)   BMI 20.41 kg/m     Physical Exam    No results found for any visits on 02/17/21.  Assessment & Plan     1. Essential (primary) hypertension Uncontrolled. Previously discontinued amlodipine due to feet swelling, but now on higher dose of diuretics. Will add amLODipine (NORVASC) 2.5 MG tablet; Take 1 tablet (2.5 mg total) by mouth daily.  Dispense: 90 tablet; Refill: 0   Follow up 6-8wk for BP check       The entirety of the  information documented in the History of Present Illness, Review of Systems and Physical Exam were personally obtained by me. Portions of this information were initially documented by the CMA and reviewed by me for thoroughness and accuracy.     Mila Merry, MD  [facility] [phone] (phone) [phone] (fax)  [facility]

## 2021-03-14 ENCOUNTER — Other Ambulatory Visit: Payer: Self-pay | Admitting: Family Medicine

## 2021-03-14 NOTE — Telephone Encounter (Signed)
Valid encounter. Future visit in 2 weeks  

## 2021-03-18 ENCOUNTER — Other Ambulatory Visit: Payer: Self-pay | Admitting: Family Medicine

## 2021-03-18 DIAGNOSIS — I10 Essential (primary) hypertension: Secondary | ICD-10-CM

## 2021-04-01 ENCOUNTER — Encounter: Payer: Self-pay | Admitting: Family Medicine

## 2021-04-01 ENCOUNTER — Other Ambulatory Visit: Payer: Self-pay

## 2021-04-01 ENCOUNTER — Ambulatory Visit (INDEPENDENT_AMBULATORY_CARE_PROVIDER_SITE_OTHER): Payer: Medicare Other | Admitting: Family Medicine

## 2021-04-01 VITALS — BP 132/50 | HR 67 | Temp 98.2°F | Resp 16 | Wt 111.0 lb

## 2021-04-01 DIAGNOSIS — I10 Essential (primary) hypertension: Secondary | ICD-10-CM

## 2021-04-01 NOTE — Progress Notes (Signed)
Established patient visit   Patient: Isabel Myers   DOB: May 10, 1930   87 y.o. Female  MRN: [id number] Visit Date: 04/01/2021  Today's healthcare provider: Lelon Huh, MD   Chief Complaint  Patient presents with   Hypertension   Subjective    HPI  Hypertension, follow-up  BP Readings from Last 3 Encounters:  04/01/21 (!) 151/71  02/17/21 (!) 153/59  10/21/20 (!) 150/70   Wt Readings from Last 3 Encounters:  04/01/21 111 lb (50.3 kg)  02/17/21 108 lb (49 kg)  10/21/20 110 lb 12.8 oz (50.3 kg)     She was last seen for hypertension on 02/17/2021.   BP at that visit was 153/59. Management since that visit includes adding amLODipine (NORVASC) 2.5 MG tablet; Take 1 tablet (2.5 mg total) by mouth daily.   She reports good compliance with treatment. She is not having side effects.  She is following a Regular diet. She is exercising. She does not smoke.  Use of agents associated with hypertension: thyroid hormones.   Outside blood pressures are checked. Patient is unsure of her average blood pressure readings.. Symptoms: No chest pain No chest pressure  No palpitations No syncope  No dyspnea No orthopnea  No paroxysmal nocturnal dyspnea No lower extremity edema   Pertinent labs: Lab Results  Component Value Date   CHOL 185 09/12/2019   HDL 81 09/12/2019   LDLCALC 93 09/12/2019   TRIG 55 09/12/2019   CHOLHDL 2.3 09/12/2019   Lab Results  Component Value Date   NA 135 09/17/2020   K 4.1 09/17/2020   CREATININE 0.80 09/17/2020   EGFR 71 09/17/2020   GFRNONAA 64 10/24/2019   GFRAA 73 10/24/2019   GLUCOSE 92 09/17/2020     The ASCVD Risk score (Arnett DK, et al., 2019) failed to calculate for the following reasons:   The 2019 ASCVD risk score is only valid for ages 26 to 31   ---------------------------------------------------------------------------------------------------     Medications: Outpatient Medications Prior to Visit  Medication Sig    amLODipine (NORVASC) 2.5 MG tablet Take 1 tablet (2.5 mg total) by mouth daily.   Calcium Carbonate (CALCIUM 600 PO) Take 1 tablet by mouth daily.   levothyroxine (SYNTHROID) 75 MCG tablet TAKE 1 TABLET EVERY DAY ON EMPTY STOMACHWITH A GLASS OF WATER AT LEAST 30-60 MINBEFORE BREAKFAST   Multiple Vitamins-Minerals (PRESERVISION AREDS 2 PO) Take 2 tablets by mouth daily at 6 (six) AM.   triamterene-hydrochlorothiazide (MAXZIDE) 75-50 MG tablet TAKE 1 TABLET BY MOUTH DAILY   triazolam (HALCION) 0.25 MG tablet Take 0.5-1 tablets (0.125-0.25 mg total) by mouth at bedtime as needed.   No facility-administered medications prior to visit.    Review of Systems  Constitutional:  Negative for appetite change, chills, fatigue and fever.  Respiratory:  Negative for chest tightness and shortness of breath.   Cardiovascular:  Negative for chest pain and palpitations.  Gastrointestinal:  Negative for abdominal pain, nausea and vomiting.  Neurological:  Negative for dizziness and weakness.      Objective    BP (!) 132/50   Pulse 67   Temp 98.2 F (36.8 C) (Temporal)   Resp 16   Wt 111 lb (50.3 kg)   BMI 20.97 kg/m   Today's Vitals   04/01/21 1020 04/01/21 1024 04/01/21 1032  BP: (!) 154/62 (!) 151/71 (!) 132/50  Pulse: 67    Resp: 16    Temp: 98.2 F (36.8 C)    TempSrc: Temporal  Weight: 111 lb (50.3 kg)     Body mass index is 20.97 kg/m.    Physical Exam  General appearance: Well developed, well nourished female, cooperative and in no acute distress Head: Normocephalic, without obvious abnormality, atraumatic Respiratory: Respirations even and unlabored, normal respiratory rate Extremities: All extremities are intact.  Skin: Skin color, texture, turgor normal. No rashes seen  Psych: Appropriate mood and affect. Neurologic: Mental status: Alert, oriented to person, place, and time, thought content appropriate.     Assessment & Plan     1. Essential (primary)  hypertension Doing well with addition of low dose amlodipine to high dose triamterene-hctz. Continue current medications.     Follow up 6 months.   She anticipates getting flu shot at the [facility]      The entirety of the information documented in the History of Present Illness, Review of Systems and Physical Exam were personally obtained by me. Portions of this information were initially documented by the CMA and reviewed by me for thoroughness and accuracy.     Lelon Huh, MD  [facility] [phone] (phone) [phone] (fax)  [facility]

## 2021-06-12 ENCOUNTER — Other Ambulatory Visit: Payer: Self-pay | Admitting: Family Medicine

## 2021-06-30 ENCOUNTER — Other Ambulatory Visit: Payer: Self-pay

## 2021-06-30 ENCOUNTER — Emergency Department
Admission: EM | Admit: 2021-06-30 | Discharge: 2021-06-30 | Disposition: A | Discharge status: Home / Self Care | Payer: Medicare Other | Attending: Emergency Medicine | Admitting: Emergency Medicine

## 2021-06-30 ENCOUNTER — Encounter: Payer: Self-pay | Admitting: Emergency Medicine

## 2021-06-30 DIAGNOSIS — E039 Hypothyroidism, unspecified: Secondary | ICD-10-CM | POA: Diagnosis not present

## 2021-06-30 DIAGNOSIS — Z79899 Other long term (current) drug therapy: Secondary | ICD-10-CM | POA: Insufficient documentation

## 2021-06-30 DIAGNOSIS — Z87891 Personal history of nicotine dependence: Secondary | ICD-10-CM | POA: Insufficient documentation

## 2021-06-30 DIAGNOSIS — W19XXXA Unspecified fall, initial encounter: Secondary | ICD-10-CM

## 2021-06-30 DIAGNOSIS — S0101XA Laceration without foreign body of scalp, initial encounter: Secondary | ICD-10-CM | POA: Insufficient documentation

## 2021-06-30 DIAGNOSIS — I10 Essential (primary) hypertension: Secondary | ICD-10-CM | POA: Insufficient documentation

## 2021-06-30 DIAGNOSIS — W01198A Fall on same level from slipping, tripping and stumbling with subsequent striking against other object, initial encounter: Secondary | ICD-10-CM | POA: Diagnosis not present

## 2021-06-30 DIAGNOSIS — S0990XA Unspecified injury of head, initial encounter: Secondary | ICD-10-CM | POA: Diagnosis present

## 2021-06-30 NOTE — ED Triage Notes (Signed)
Presents via EMS s/p fall   states she fell hitting her head on table

## 2021-06-30 NOTE — ED Provider Notes (Signed)
[facility] Emergency Department Provider Note   ____________________________________________    I have reviewed the triage vital signs and the nursing notes.   HISTORY  Chief Complaint Fall     HPI Isabel Myers is a 87 y.o. female who presents after a fall.  Patient reports she lost her balance, fell backwards and hit her head on a plant table.  She is not on blood thinners.  She denies LOC.  She denies neck pain.  She came in because she noticed some bleeding from her scalp.  This occurred approximately 2-1/2 to 3 hours ago.  No nausea or vomiting.  No neurodeficits.  Past Medical History:  Diagnosis Date   Hypertension    Thyroid disease    hypothyroid    Patient Active Problem List   Diagnosis Date Noted   Hypokalemia 09/16/2015   Hypothyroidism 09/16/2015   Insomnia 09/16/2015   Low back pain 09/16/2015   Osteoarthritis 09/16/2015   Spondylolisthesis of lumbar region 09/16/2015   Degeneration of intervertebral disc of lumbar region 01/01/2015   Lumbar canal stenosis 01/01/2015   Asymptomatic varicose veins of lower extremity 10/26/2008   Essential (primary) hypertension 07/13/1998    Past Surgical History:  Procedure Laterality Date   ABDOMINAL HYSTERECTOMY  1987   with BSO   Bladder tack  2009   Dr. Chrissie Noa   BUNIONECTOMY     CATARACT EXTRACTION, BILATERAL     CT Cervical Spine  10/22/2011   negative   CT Scan of head  10/22/2011   without contrast, [facility], Normal   THYROIDECTOMY  1937   SUBTOTAL   TONSILLECTOMY AND ADENOIDECTOMY      Prior to Admission medications   Medication Sig Start Date End Date Taking? Authorizing Provider  amLODipine (NORVASC) 2.5 MG tablet Take 1 tablet (2.5 mg total) by mouth daily. 03/18/21   [name], MD  Calcium Carbonate (CALCIUM 600 PO) Take 1 tablet by mouth daily. 10/26/08   [provider]  levothyroxine (SYNTHROID) 75 MCG tablet TAKE 1 TABLET EVERY DAY ON EMPTY  STOMACHWITH A GLASS OF WATER AT LEAST 30-60 MINBEFORE BREAKFAST 06/12/21   [name], MD  Multiple Vitamins-Minerals (PRESERVISION AREDS 2 PO) Take 2 tablets by mouth daily at 6 (six) AM.    [provider]  triamterene-hydrochlorothiazide (MAXZIDE) 75-50 MG tablet TAKE 1 TABLET BY MOUTH DAILY 03/18/21   [name], MD  triazolam (HALCION) 0.25 MG tablet Take 0.5-1 tablets (0.125-0.25 mg total) by mouth at bedtime as needed. 08/15/18   [name], MD     Allergies Amlodipine  Family History  Problem Relation Age of Onset   Memory loss Sister        short term memory loss    Social History Social History   Tobacco Use   Smoking status: Former    Packs/day: 0.50    Years: 15.00    Pack years: 7.50    Types: Cigarettes   Smokeless tobacco: Never   Tobacco comments:    Quit in the 1970s  Vaping Use   Vaping Use: Never used  Substance Use Topics   Alcohol use: Yes    Alcohol/week: 7.0 standard drinks    Types: 7 Glasses of wine per week    Comment: 1 glass a day   Drug use: No    Review of Systems  Constitutional: No dizziness  ENT: No facial injury   Gastrointestinal: No abdominal pain.  No nausea, no vomiting.  Musculoskeletal: No neck pain Skin: As above Neurological: Negative for neurodeficits    ____________________________________________   PHYSICAL EXAM:  VITAL SIGNS: ED Triage Vitals  Enc Vitals Group     BP 06/30/21 1242 (!) 180/72     Pulse Rate 06/30/21 1242 79     Resp 06/30/21 1242 18     Temp 06/30/21 1242 97.6 F (36.4 C)     Temp Source 06/30/21 1242 Oral     SpO2 06/30/21 1242 94 %     Weight 06/30/21 1127 50.3 kg (110 lb 14.3 oz)     Height 06/30/21 1127 1.549 m (5\' 1" )     Head Circumference --      Peak Flow --      Pain Score 06/30/21 1126 3     Pain Loc --      Pain Edu? --      Excl. in GC? --      Constitutional: Alert and oriented. No acute distress. Pleasant and interactive Eyes: Conjunctivae  are normal.  Head: Approximately 1 cm vertical laceration of the crown of the scalp, bleeding controlled Nose: No congestion/rhinnorhea. Mouth/Throat: Mucous membranes are moist.   Cardiovascular: Normal rate, regular rhythm.  Respiratory: Normal respiratory effort.  No retractions.  Musculoskeletal: No lower extremity tenderness nor edema.   Neurologic:  Normal speech and language. No gross focal neurologic deficits are appreciated.   Skin:  Skin is warm, dry    ____________________________________________   LABS (all labs ordered are listed, but only abnormal results are displayed)  Labs Reviewed - No data to display ____________________________________________  EKG   ____________________________________________  RADIOLOGY   ____________________________________________   PROCEDURES  Procedure(s) performed: yes .12/21/22Laceration Repair  Date/Time: 06/30/2021 3:32 PM Performed by: 07/02/2021, MD Authorized by: Jene Every, MD   Consent:    Consent obtained:  Verbal   Consent given by:  Patient Anesthesia:    Anesthesia method:  None Laceration details:    Location:  Scalp   Scalp location:  Crown   Length (cm):  1 Treatment:    Area cleansed with:  Soap and water   Amount of cleaning:  Standard   Irrigation solution:  Sterile saline Skin repair:    Repair method:  Staples   Number of staples:  1 Approximation:    Approximation:  Close Repair type:    Repair type:  Simple Post-procedure details:    Dressing:  Open (no dressing)   Critical Care performed: No ____________________________________________   INITIAL IMPRESSION / ASSESSMENT AND PLAN / ED COURSE  Pertinent labs & imaging results that were available during my care of the patient were reviewed by me and considered in my medical decision making (see chart for details).   Patient with isolated scalp wound, only 1 cm, bleeding controlled.  Mild injury, no indication for imaging at this  time.  Patient is neuro intact, well-appearing with no complaints.  Laceration repaired with 1 staple, removal in 5 to 6 days   ____________________________________________   FINAL CLINICAL IMPRESSION(S) / ED DIAGNOSES  Final diagnoses:  Fall, initial encounter  Laceration of scalp, initial encounter      NEW MEDICATIONS STARTED DURING THIS VISIT:  Discharge Medication List as of 06/30/2021  1:00 PM       Note:  This document was prepared using Dragon voice recognition software and may include unintentional dictation errors.    07/02/2021, MD 06/30/21 [phone]

## 2021-07-03 ENCOUNTER — Ambulatory Visit (INDEPENDENT_AMBULATORY_CARE_PROVIDER_SITE_OTHER): Payer: Medicare Other

## 2021-07-03 DIAGNOSIS — Z0000 Encounter for general adult medical examination without abnormal findings: Secondary | ICD-10-CM | POA: Diagnosis not present

## 2021-07-03 NOTE — Progress Notes (Signed)
Virtual Visit via Telephone Note  I connected with  Isabel Myers on 07/03/21 at  9:00 AM EST by telephone and verified that I am speaking with the correct person using two identifiers.  Location: Patient: home Provider: [facility] Persons participating in the virtual visit: patient/Nurse Health Advisor   I discussed the limitations, risks, security and privacy concerns of performing an evaluation and management service by telephone and the availability of in person appointments. The patient expressed understanding and agreed to proceed.  Interactive audio and video telecommunications were attempted between this nurse and patient, however failed, due to patient having technical difficulties OR patient did not have access to video capability.  We continued and completed visit with audio only.  Some vital signs may be absent or patient reported.   Hal Hope, LPN  Subjective:   Isabel Myers is a 87 y.o. female who presents for Medicare Annual (Subsequent) preventive examination.  Review of Systems           Objective:    There were no vitals filed for this visit. There is no height or weight on file to calculate BMI.  Advanced Directives 06/30/2021 06/25/2020 06/14/2019 05/22/2019 06/02/2018 05/31/2017  Does Patient Have a Medical Advance Directive? No Yes Yes Yes Yes Yes  Type of Advance Directive - Healthcare Power of [place];Living will Healthcare Power of [place];Living will Living will Living will;Healthcare Power of [employer] Power of [place];Living will  Copy of Healthcare Power of Attorney in Chart? - No - copy requested No - copy requested - No - copy requested No - copy requested  Would patient like information on creating a medical advance directive? No - Patient declined - - No - Patient declined - -    Current Medications (verified) Outpatient Encounter Medications as of 07/03/2021  Medication Sig   amLODipine (NORVASC) 2.5 MG tablet Take 1 tablet  (2.5 mg total) by mouth daily.   Calcium Carbonate (CALCIUM 600 PO) Take 1 tablet by mouth daily.   levothyroxine (SYNTHROID) 75 MCG tablet TAKE 1 TABLET EVERY DAY ON EMPTY STOMACHWITH A GLASS OF WATER AT LEAST 30-60 MINBEFORE BREAKFAST   Multiple Vitamins-Minerals (PRESERVISION AREDS 2 PO) Take 2 tablets by mouth daily at 6 (six) AM.   triamterene-hydrochlorothiazide (MAXZIDE) 75-50 MG tablet TAKE 1 TABLET BY MOUTH DAILY   triazolam (HALCION) 0.25 MG tablet Take 0.5-1 tablets (0.125-0.25 mg total) by mouth at bedtime as needed.   No facility-administered encounter medications on file as of 07/03/2021.    Allergies (verified) Amlodipine   History: Past Medical History:  Diagnosis Date   Hypertension    Thyroid disease    hypothyroid   Past Surgical History:  Procedure Laterality Date   ABDOMINAL HYSTERECTOMY  1987   with BSO   Bladder tack  2009   Dr. Chrissie Noa   BUNIONECTOMY     CATARACT EXTRACTION, BILATERAL     CT Cervical Spine  10/22/2011   negative   CT Scan of head  10/22/2011   without contrast, [facility], Normal   THYROIDECTOMY  1937   SUBTOTAL   TONSILLECTOMY AND ADENOIDECTOMY     Family History  Problem Relation Age of Onset   Memory loss Sister        short term memory loss   Social History   Socioeconomic History   Marital status: Widowed    Spouse name: Not on file   Number of children: 2   Years of education: Not on file   Highest education  level: Master's degree (e.g., MA, MS, MEng, MEd, MSW, MBA)  Occupational History   Occupation: Retired  Tobacco Use   Smoking status: Former    Packs/day: 0.50    Years: 15.00    Pack years: 7.50    Types: Cigarettes   Smokeless tobacco: Never   Tobacco comments:    Quit in the 1970s  Vaping Use   Vaping Use: Never used  Substance and Sexual Activity   Alcohol use: Yes    Alcohol/week: 7.0 standard drinks    Types: 7 Glasses of wine per week    Comment: 1 glass a day   Drug use: No   Sexual activity:  Not on file  Other Topics Concern   Not on file  Social History Narrative   Not on file   Social Determinants of Health   Financial Resource Strain: Not on file  Food Insecurity: Not on file  Transportation Needs: Not on file  Physical Activity: Not on file  Stress: Not on file  Social Connections: Not on file    Tobacco Counseling Counseling given: Not Answered Tobacco comments: Quit in the 1970s   Clinical Intake:  Pre-visit preparation completed: No  Pain : No/denies pain     Nutritional Risks: None Diabetes: No  How often do you need to have someone help you when you read instructions, pamphlets, or other written materials from your doctor or pharmacy?: 1 - Never  Diabetic?no  Interpreter Needed?: No  Information entered by :: Kennedy Bucker, LPN   Activities of Daily Living In your present state of health, do you have any difficulty performing the following activities: 09/17/2020  Hearing? N  Vision? N  Difficulty concentrating or making decisions? N  Walking or climbing stairs? Y  Dressing or bathing? N  Doing errands, shopping? N  Some recent data might be hidden    Patient Care Team: [name], MD as PCP - General (Family Medicine) Dingeldein, Viviann Spare, MD (Ophthalmology) Fransico Michael, MD as Referring Physician (Audiology)  Indicate any recent Medical Services you may have received from other than [facility] providers in the past year (date may be approximate).     Assessment:   This is a routine wellness examination for [name].  Hearing/Vision screen No results found.  Dietary issues and exercise activities discussed:     Goals Addressed   None    Depression Screen PHQ 2/9 Scores 09/17/2020 06/25/2020 06/14/2019 06/02/2018 06/02/2018 05/31/2017 05/31/2017  PHQ - 2 Score 0 0 0 0 0 0 0  PHQ- 9 Score 0 - - 0 - 0 -    Fall Risk Fall Risk  09/17/2020 06/25/2020 06/14/2019 06/02/2018 05/31/2017  Falls in the past year? 0 0 0 0 No   Number falls in past yr: 0 0 0 - -  Injury with Fall? 0 0 0 - -    FALL RISK PREVENTION PERTAINING TO THE HOME:  Any stairs in or around the home? No  If so, are there any without handrails? No  Home free of loose throw rugs in walkways, pet beds, electrical cords, etc? Yes  Adequate lighting in your home to reduce risk of falls? Yes   ASSISTIVE DEVICES UTILIZED TO PREVENT FALLS:  Life alert? No  Use of a cane, walker or w/c? Yes  Grab bars in the bathroom? Yes  Shower chair or bench in shower? No  Elevated toilet seat or a handicapped toilet? No   Cognitive Function:Normal cognitive status assessed by direct observation by  this Nurse Health Advisor. No abnormalities found.       6CIT Screen 06/02/2018 05/31/2017  What Year? 0 points 0 points  What month? 0 points -  What time? 0 points 0 points  Count back from 20 0 points 0 points  Months in reverse 0 points 0 points  Repeat phrase 0 points 0 points  Total Score 0 -    Immunizations Immunization History  Administered Date(s) Administered   DTaP 07/28/1993   Influenza, High Dose Seasonal PF 04/12/2020   Influenza-Unspecified 04/12/2016, 04/23/2018   Moderna Sars-Covid-2 Vaccination 07/25/2019, 08/23/2019, 05/23/2020, 11/22/2020   Pneumococcal Conjugate-13 04/06/2014   Pneumococcal Polysaccharide-23 07/28/2006   Td 08/09/2007   Tdap 11/30/2011   Zoster Recombinat (Shingrix) 06/29/2019   Zoster, Live 03/05/2006    TDAP status: Up to date  Flu Vaccine status: Up to date  Pneumococcal vaccine status: Up to date  Covid-19 vaccine status: Completed vaccines  Qualifies for Shingles Vaccine? Yes   Zostavax completed Yes   Shingrix Completed?: Yes  Screening Tests Health Maintenance  Topic Date Due   Zoster Vaccines- Shingrix (2 of 2) 08/24/2019   COVID-19 Vaccine (5 - Booster for Moderna series) 01/17/2021   INFLUENZA VACCINE  10/10/2021 (Originally 02/10/2021)   TETANUS/TDAP  11/29/2021   DEXA SCAN   09/15/2022   Pneumonia Vaccine 29+ Years old  Completed   HPV VACCINES  Aged Out    Health Maintenance  Health Maintenance Due  Topic Date Due   Zoster Vaccines- Shingrix (2 of 2) 08/24/2019   COVID-19 Vaccine (5 - Booster for Moderna series) 01/17/2021    Colorectal cancer screening: No longer required.   Mammogram status: No longer required due to age, pt declined.  Bone Density status: Completed 09/14/17. Results reflect: Bone density results: NORMAL. Repeat every 5 years.  Lung Cancer Screening: (Low Dose CT Chest recommended if Age 12-80 years, 30 pack-year currently smoking OR have quit w/in 15years.) does not qualify.    Additional Screening:  Hepatitis C Screening: does not qualify; Completed no  Vision Screening: Recommended annual ophthalmology exams for early detection of glaucoma and other disorders of the eye. Is the patient up to date with their annual eye exam?  Yes  Who is the provider or what is the name of the office in which the patient attends annual eye exams? [facility] If pt is not established with a provider, would they like to be referred to a provider to establish care? No .   Dental Screening: Recommended annual dental exams for proper oral hygiene  Community Resource Referral / Chronic Care Management: CRR required this visit?  No   CCM required this visit?  No      Plan:     I have personally reviewed and noted the following in the patients chart:   Medical and social history Use of alcohol, tobacco or illicit drugs  Current medications and supplements including opioid prescriptions.  Functional ability and status Nutritional status Physical activity Advanced directives List of other physicians Hospitalizations, surgeries, and ER visits in previous 12 months Vitals Screenings to include cognitive, depression, and falls Referrals and appointments  In addition, I have reviewed and discussed with patient certain preventive  protocols, quality metrics, and best practice recommendations. A written personalized care plan for preventive services as well as general preventive health recommendations were provided to patient.     Hal Hope, LPN   [id number]/03/[id number]   Nurse Notes: none

## 2021-07-03 NOTE — Patient Instructions (Signed)
[name] , Thank you for taking time to come for your Medicare Wellness Visit. I appreciate your ongoing commitment to your health goals. Please review the following plan we discussed and let me know if I can assist you in the future.   Screening recommendations/referrals: Colonoscopy: aged out Mammogram: declined Bone Density: 09/14/17 Recommended yearly ophthalmology/optometry visit for glaucoma screening and checkup Recommended yearly dental visit for hygiene and checkup  Vaccinations: Influenza vaccine: got at [facility] Pneumococcal vaccine: 04/06/14 Tdap vaccine: 11/30/11 Shingles vaccine: 06/19/19, got second shot [facility]   Covid-19:07/25/19, 08/23/19, 05/23/20, 11/22/20  Advanced directives: yes  Conditions/risks identified: none  Next appointment: Follow up in one year for your annual wellness visit 07/07/22 @ 1:40pm   Preventive Care 65 Years and Older, Female Preventive care refers to lifestyle choices and visits with your health care provider that can promote health and wellness. What does preventive care include? A yearly physical exam. This is also called an annual well check. Dental exams once or twice a year. Routine eye exams. Ask your health care provider how often you should have your eyes checked. Personal lifestyle choices, including: Daily care of your teeth and gums. Regular physical activity. Eating a healthy diet. Avoiding tobacco and drug use. Limiting alcohol use. Practicing safe sex. Taking low-dose aspirin every day. Taking vitamin and mineral supplements as recommended by your health care provider. What happens during an annual well check? The services and screenings done by your health care provider during your annual well check will depend on your age, overall health, lifestyle risk factors, and family history of disease. Counseling  Your health care provider may ask you questions about your: Alcohol use. Tobacco use. Drug use. Emotional  well-being. Home and relationship well-being. Sexual activity. Eating habits. History of falls. Memory and ability to understand (cognition). Work and work Astronomer. Reproductive health. Screening  You may have the following tests or measurements: Height, weight, and BMI. Blood pressure. Lipid and cholesterol levels. These may be checked every 5 years, or more frequently if you are over 12 years old. Skin check. Lung cancer screening. You may have this screening every year starting at age 26 if you have a 30-pack-year history of smoking and currently smoke or have quit within the past 15 years. Fecal occult blood test (FOBT) of the stool. You may have this test every year starting at age 41. Flexible sigmoidoscopy or colonoscopy. You may have a sigmoidoscopy every 5 years or a colonoscopy every 10 years starting at age 92. Hepatitis C blood test. Hepatitis B blood test. Sexually transmitted disease (STD) testing. Diabetes screening. This is done by checking your blood sugar (glucose) after you have not eaten for a while (fasting). You may have this done every 1-3 years. Bone density scan. This is done to screen for osteoporosis. You may have this done starting at age 22. Mammogram. This may be done every 1-2 years. Talk to your health care provider about how often you should have regular mammograms. Talk with your health care provider about your test results, treatment options, and if necessary, the need for more tests. Vaccines  Your health care provider may recommend certain vaccines, such as: Influenza vaccine. This is recommended every year. Tetanus, diphtheria, and acellular pertussis (Tdap, Td) vaccine. You may need a Td booster every 10 years. Zoster vaccine. You may need this after age 39. Pneumococcal 13-valent conjugate (PCV13) vaccine. One dose is recommended after age 80. Pneumococcal polysaccharide (PPSV23) vaccine. One dose is recommended after  age 52. Talk to your  health care provider about which screenings and vaccines you need and how often you need them. This information is not intended to replace advice given to you by your health care provider. Make sure you discuss any questions you have with your health care provider. Document Released: 07/26/2015 Document Revised: 03/18/2016 Document Reviewed: 04/30/2015 Elsevier Interactive Patient Education  2017 [place] Prevention in the Home Falls can cause injuries. They can happen to people of all ages. There are many things you can do to make your home safe and to help prevent falls. What can I do on the outside of my home? Regularly fix the edges of walkways and driveways and fix any cracks. Remove anything that might make you trip as you walk through a door, such as a raised step or threshold. Trim any bushes or trees on the path to your home. Use bright outdoor lighting. Clear any walking paths of anything that might make someone trip, such as rocks or tools. Regularly check to see if handrails are loose or broken. Make sure that both sides of any steps have handrails. Any raised decks and porches should have guardrails on the edges. Have any leaves, snow, or ice cleared regularly. Use sand or salt on walking paths during winter. Clean up any spills in your garage right away. This includes oil or grease spills. What can I do in the bathroom? Use night lights. Install grab bars by the toilet and in the tub and shower. Do not use towel bars as grab bars. Use non-skid mats or decals in the tub or shower. If you need to sit down in the shower, use a plastic, non-slip stool. Keep the floor dry. Clean up any water that spills on the floor as soon as it happens. Remove soap buildup in the tub or shower regularly. Attach bath mats securely with double-sided non-slip rug tape. Do not have throw rugs and other things on the floor that can make you trip. What can I do in the bedroom? Use night  lights. Make sure that you have a light by your bed that is easy to reach. Do not use any sheets or blankets that are too big for your bed. They should not hang down onto the floor. Have a firm chair that has side arms. You can use this for support while you get dressed. Do not have throw rugs and other things on the floor that can make you trip. What can I do in the kitchen? Clean up any spills right away. Avoid walking on wet floors. Keep items that you use a lot in easy-to-reach places. If you need to reach something above you, use a strong step stool that has a grab bar. Keep electrical cords out of the way. Do not use floor polish or wax that makes floors slippery. If you must use wax, use non-skid floor wax. Do not have throw rugs and other things on the floor that can make you trip. What can I do with my stairs? Do not leave any items on the stairs. Make sure that there are handrails on both sides of the stairs and use them. Fix handrails that are broken or loose. Make sure that handrails are as long as the stairways. Check any carpeting to make sure that it is firmly attached to the stairs. Fix any carpet that is loose or worn. Avoid having throw rugs at the top or bottom of the stairs. If you do  have throw rugs, attach them to the floor with carpet tape. Make sure that you have a light switch at the top of the stairs and the bottom of the stairs. If you do not have them, ask someone to add them for you. What else can I do to help prevent falls? Wear shoes that: Do not have high heels. Have rubber bottoms. Are comfortable and fit you well. Are closed at the toe. Do not wear sandals. If you use a stepladder: Make sure that it is fully opened. Do not climb a closed stepladder. Make sure that both sides of the stepladder are locked into place. Ask someone to hold it for you, if possible. Clearly mark and make sure that you can see: Any grab bars or handrails. First and last  steps. Where the edge of each step is. Use tools that help you move around (mobility aids) if they are needed. These include: Canes. Walkers. Scooters. Crutches. Turn on the lights when you go into a dark area. Replace any light bulbs as soon as they burn out. Set up your furniture so you have a clear path. Avoid moving your furniture around. If any of your floors are uneven, fix them. If there are any pets around you, be aware of where they are. Review your medicines with your doctor. Some medicines can make you feel dizzy. This can increase your chance of falling. Ask your doctor what other things that you can do to help prevent falls. This information is not intended to replace advice given to you by your health care provider. Make sure you discuss any questions you have with your health care provider. Document Released: 04/25/2009 Document Revised: 12/05/2015 Document Reviewed: 08/03/2014 Elsevier Interactive Patient Education  2017 [employer].

## 2021-08-06 DIAGNOSIS — M2041 Other hammer toe(s) (acquired), right foot: Secondary | ICD-10-CM | POA: Diagnosis not present

## 2021-08-06 DIAGNOSIS — M2011 Hallux valgus (acquired), right foot: Secondary | ICD-10-CM | POA: Diagnosis not present

## 2021-08-06 DIAGNOSIS — M79671 Pain in right foot: Secondary | ICD-10-CM | POA: Diagnosis not present

## 2021-08-06 DIAGNOSIS — M2042 Other hammer toe(s) (acquired), left foot: Secondary | ICD-10-CM | POA: Diagnosis not present

## 2021-08-06 DIAGNOSIS — M216X1 Other acquired deformities of right foot: Secondary | ICD-10-CM | POA: Diagnosis not present

## 2021-08-06 DIAGNOSIS — M216X2 Other acquired deformities of left foot: Secondary | ICD-10-CM | POA: Diagnosis not present

## 2021-08-06 DIAGNOSIS — B351 Tinea unguium: Secondary | ICD-10-CM | POA: Diagnosis not present

## 2021-08-06 DIAGNOSIS — M79672 Pain in left foot: Secondary | ICD-10-CM | POA: Diagnosis not present

## 2021-08-06 DIAGNOSIS — L851 Acquired keratosis [keratoderma] palmaris et plantaris: Secondary | ICD-10-CM | POA: Diagnosis not present

## 2021-08-19 DIAGNOSIS — I10 Essential (primary) hypertension: Secondary | ICD-10-CM | POA: Diagnosis not present

## 2021-08-19 DIAGNOSIS — E039 Hypothyroidism, unspecified: Secondary | ICD-10-CM | POA: Diagnosis not present

## 2021-08-19 DIAGNOSIS — G8929 Other chronic pain: Secondary | ICD-10-CM | POA: Diagnosis not present

## 2021-08-19 DIAGNOSIS — M1990 Unspecified osteoarthritis, unspecified site: Secondary | ICD-10-CM | POA: Diagnosis not present

## 2021-08-19 DIAGNOSIS — Z87891 Personal history of nicotine dependence: Secondary | ICD-10-CM | POA: Diagnosis not present

## 2021-08-19 DIAGNOSIS — Z9849 Cataract extraction status, unspecified eye: Secondary | ICD-10-CM | POA: Diagnosis not present

## 2021-09-03 DIAGNOSIS — L853 Xerosis cutis: Secondary | ICD-10-CM | POA: Diagnosis not present

## 2021-09-12 ENCOUNTER — Other Ambulatory Visit: Payer: Self-pay | Admitting: Family Medicine

## 2021-09-12 NOTE — Telephone Encounter (Signed)
Requested Prescriptions  ?Pending Prescriptions Disp Refills  ?? levothyroxine (SYNTHROID) 75 MCG tablet [Pharmacy Med Name: LEVOTHYROXINE SODIUM 75 MCG TAB] 30 tablet 2  ?  Sig: TAKE 1 TABLET EVERY DAY ON EMPTY STOMACHWITH A GLASS OF WATER AT LEAST 30-60 MINBEFORE BREAKFAST  ?  ? Endocrinology:  Hypothyroid Agents Passed - 09/12/2021 10:30 AM  ?  ?  Passed - TSH in normal range and within 360 days  ?  TSH  ?Date Value Ref Range Status  ?09/17/2020 1.340 0.450 - 4.500 uIU/mL Final  ?   ?  ?  Passed - Valid encounter within last 12 months  ?  Recent Outpatient Visits   ?      ? 5 months ago Essential (primary) hypertension  ? [facility] Birdie Sons, MD  ? 6 months ago Essential (primary) hypertension  ? [facility] Birdie Sons, MD  ? 10 months ago Essential (primary) hypertension  ? [facility] Birdie Sons, MD  ? 12 months ago Hypothyroidism, unspecified type  ? [facility] Caryn Section, Kirstie Peri, MD  ? 1 year ago Essential (primary) hypertension  ? [facility] Caryn Section, Kirstie Peri, MD  ?  ?  ?Future Appointments   ?        ? In 2 weeks Fisher, Kirstie Peri, MD [facility]  ?  ? ?  ?  ?  ? ? ?

## 2021-09-29 ENCOUNTER — Ambulatory Visit: Payer: Medicare Other | Admitting: Family Medicine

## 2021-10-03 ENCOUNTER — Emergency Department: Payer: PPO

## 2021-10-03 ENCOUNTER — Encounter: Payer: Self-pay | Admitting: Emergency Medicine

## 2021-10-03 ENCOUNTER — Observation Stay: Payer: PPO

## 2021-10-03 ENCOUNTER — Observation Stay
Admission: EM | Admit: 2021-10-03 | Discharge: 2021-10-04 | Disposition: A | Discharge status: Home / Self Care | Payer: PPO | Attending: Internal Medicine | Admitting: Internal Medicine

## 2021-10-03 ENCOUNTER — Other Ambulatory Visit: Payer: Self-pay

## 2021-10-03 DIAGNOSIS — Z743 Need for continuous supervision: Secondary | ICD-10-CM | POA: Diagnosis not present

## 2021-10-03 DIAGNOSIS — E860 Dehydration: Secondary | ICD-10-CM | POA: Diagnosis not present

## 2021-10-03 DIAGNOSIS — M542 Cervicalgia: Secondary | ICD-10-CM | POA: Diagnosis not present

## 2021-10-03 DIAGNOSIS — E039 Hypothyroidism, unspecified: Secondary | ICD-10-CM | POA: Insufficient documentation

## 2021-10-03 DIAGNOSIS — R911 Solitary pulmonary nodule: Secondary | ICD-10-CM | POA: Diagnosis not present

## 2021-10-03 DIAGNOSIS — Z79899 Other long term (current) drug therapy: Secondary | ICD-10-CM | POA: Diagnosis not present

## 2021-10-03 DIAGNOSIS — Z87891 Personal history of nicotine dependence: Secondary | ICD-10-CM | POA: Insufficient documentation

## 2021-10-03 DIAGNOSIS — A419 Sepsis, unspecified organism: Secondary | ICD-10-CM | POA: Diagnosis not present

## 2021-10-03 DIAGNOSIS — R42 Dizziness and giddiness: Secondary | ICD-10-CM | POA: Diagnosis not present

## 2021-10-03 DIAGNOSIS — I700 Atherosclerosis of aorta: Secondary | ICD-10-CM | POA: Diagnosis not present

## 2021-10-03 DIAGNOSIS — I10 Essential (primary) hypertension: Secondary | ICD-10-CM | POA: Diagnosis not present

## 2021-10-03 DIAGNOSIS — U071 COVID-19: Secondary | ICD-10-CM | POA: Insufficient documentation

## 2021-10-03 DIAGNOSIS — E871 Hypo-osmolality and hyponatremia: Secondary | ICD-10-CM

## 2021-10-03 DIAGNOSIS — W19XXXA Unspecified fall, initial encounter: Secondary | ICD-10-CM | POA: Diagnosis not present

## 2021-10-03 DIAGNOSIS — J984 Other disorders of lung: Secondary | ICD-10-CM | POA: Diagnosis not present

## 2021-10-03 DIAGNOSIS — R404 Transient alteration of awareness: Secondary | ICD-10-CM | POA: Diagnosis not present

## 2021-10-03 DIAGNOSIS — R55 Syncope and collapse: Principal | ICD-10-CM | POA: Insufficient documentation

## 2021-10-03 DIAGNOSIS — R6889 Other general symptoms and signs: Secondary | ICD-10-CM | POA: Diagnosis not present

## 2021-10-03 DIAGNOSIS — I499 Cardiac arrhythmia, unspecified: Secondary | ICD-10-CM | POA: Diagnosis not present

## 2021-10-03 DIAGNOSIS — R519 Headache, unspecified: Secondary | ICD-10-CM | POA: Diagnosis not present

## 2021-10-03 DIAGNOSIS — E876 Hypokalemia: Secondary | ICD-10-CM | POA: Diagnosis not present

## 2021-10-03 DIAGNOSIS — R918 Other nonspecific abnormal finding of lung field: Secondary | ICD-10-CM | POA: Diagnosis not present

## 2021-10-03 LAB — URINALYSIS, COMPLETE (UACMP) WITH MICROSCOPIC
Bilirubin Urine: NEGATIVE
Glucose, UA: NEGATIVE mg/dL
Hgb urine dipstick: NEGATIVE
Ketones, ur: 5 mg/dL — AB
Nitrite: NEGATIVE
Protein, ur: NEGATIVE mg/dL
Specific Gravity, Urine: 1.01 (ref 1.005–1.030)
Squamous Epithelial / HPF: NONE SEEN (ref 0–5)
pH: 8 (ref 5.0–8.0)

## 2021-10-03 LAB — COMPREHENSIVE METABOLIC PANEL
ALT: 18 U/L (ref 0–44)
AST: 29 U/L (ref 15–41)
Albumin: 4 g/dL (ref 3.5–5.0)
Alkaline Phosphatase: 44 U/L (ref 38–126)
Anion gap: 10 (ref 5–15)
BUN: 16 mg/dL (ref 8–23)
CO2: 27 mmol/L (ref 22–32)
Calcium: 8.9 mg/dL (ref 8.9–10.3)
Chloride: 91 mmol/L — ABNORMAL LOW (ref 98–111)
Creatinine, Ser: 0.85 mg/dL (ref 0.44–1.00)
GFR, Estimated: >60 mL/min (ref >60)
Glucose, Bld: 100 mg/dL — ABNORMAL HIGH (ref 70–99)
Potassium: 3.2 mmol/L — ABNORMAL LOW (ref 3.5–5.1)
Sodium: 128 mmol/L — ABNORMAL LOW (ref 135–145)
Total Bilirubin: 0.9 mg/dL (ref 0.3–1.2)
Total Protein: 7 g/dL (ref 6.5–8.1)

## 2021-10-03 LAB — APTT: aPTT: 36 seconds (ref 24–36)

## 2021-10-03 LAB — RESP PANEL BY RT-PCR (FLU A&B, COVID) ARPGX2
Influenza A by PCR: NEGATIVE
Influenza B by PCR: NEGATIVE
SARS Coronavirus 2 by RT PCR: POSITIVE — AB

## 2021-10-03 LAB — TROPONIN I (HIGH SENSITIVITY)
Troponin I (High Sensitivity): 8 ng/L (ref <18)
Troponin I (High Sensitivity): 9 ng/L (ref <18)

## 2021-10-03 LAB — CBG MONITORING, ED: Glucose-Capillary: 109 mg/dL — ABNORMAL HIGH (ref 70–99)

## 2021-10-03 LAB — LACTIC ACID, PLASMA: Lactic Acid, Venous: 0.9 mmol/L (ref 0.5–1.9)

## 2021-10-03 LAB — CBC
HCT: 38.1 % (ref 36.0–46.0)
Hemoglobin: 12.5 g/dL (ref 12.0–15.0)
MCH: 28.8 pg (ref 26.0–34.0)
MCHC: 32.8 g/dL (ref 30.0–36.0)
MCV: 87.8 fL (ref 80.0–100.0)
Platelets: 259 10*3/uL (ref 150–400)
RBC: 4.34 MIL/uL (ref 3.87–5.11)
RDW: 14 % (ref 11.5–15.5)
WBC: 9.3 10*3/uL (ref 4.0–10.5)
nRBC: 0 % (ref 0.0–0.2)

## 2021-10-03 LAB — PROTIME-INR
INR: 1 (ref 0.8–1.2)
Prothrombin Time: 13.4 seconds (ref 11.4–15.2)

## 2021-10-03 MED ORDER — SODIUM CHLORIDE 0.9 % IV SOLN
1.0000 g | Freq: Once | INTRAVENOUS | Status: AC
  Administered 2021-10-03: 1 g via INTRAVENOUS
  Filled 2021-10-03: qty 10

## 2021-10-03 MED ORDER — ONDANSETRON HCL 4 MG/2ML IJ SOLN: 4.0000 mg | Freq: Four times a day (QID) | INTRAMUSCULAR | Status: DC | PRN

## 2021-10-03 MED ORDER — POLYETHYLENE GLYCOL 3350 17 G PO PACK: 17.0000 g | PACK | Freq: Every day | ORAL | Status: DC | PRN

## 2021-10-03 MED ORDER — ALBUTEROL SULFATE (2.5 MG/3ML) 0.083% IN NEBU: 3.0000 mL | INHALATION_SOLUTION | Freq: Four times a day (QID) | RESPIRATORY_TRACT | Status: DC | PRN

## 2021-10-03 MED ORDER — POTASSIUM CHLORIDE CRYS ER 20 MEQ PO TBCR
40.0000 meq | EXTENDED_RELEASE_TABLET | Freq: Once | ORAL | Status: AC
  Administered 2021-10-03: 40 meq via ORAL
  Filled 2021-10-03: qty 2

## 2021-10-03 MED ORDER — LACTATED RINGERS IV SOLN: INTRAVENOUS | Status: DC

## 2021-10-03 MED ORDER — SODIUM CHLORIDE 0.9 % IV BOLUS
500.0000 mL | Freq: Once | INTRAVENOUS | Status: AC
  Administered 2021-10-03: 500 mL via INTRAVENOUS

## 2021-10-03 MED ORDER — IOHEXOL 300 MG/ML  SOLN
75.0000 mL | Freq: Once | INTRAMUSCULAR | Status: AC | PRN
  Administered 2021-10-03: 75 mL via INTRAVENOUS

## 2021-10-03 MED ORDER — ASCORBIC ACID 500 MG PO TABS
500.0000 mg | ORAL_TABLET | Freq: Every day | ORAL | Status: DC
  Administered 2021-10-04: 500 mg via ORAL
  Filled 2021-10-03: qty 1

## 2021-10-03 MED ORDER — MELATONIN 5 MG PO TABS
10.0000 mg | ORAL_TABLET | Freq: Every evening | ORAL | Status: DC | PRN
  Administered 2021-10-03: 10 mg via ORAL
  Filled 2021-10-03: qty 2

## 2021-10-03 MED ORDER — ONDANSETRON HCL 4 MG PO TABS: 4.0000 mg | ORAL_TABLET | Freq: Four times a day (QID) | ORAL | Status: DC | PRN

## 2021-10-03 MED ORDER — ACETAMINOPHEN 325 MG PO TABS: 650.0000 mg | ORAL_TABLET | Freq: Four times a day (QID) | ORAL | Status: DC | PRN

## 2021-10-03 MED ORDER — NIRMATRELVIR/RITONAVIR (PAXLOVID)TABLET
3.0000 | ORAL_TABLET | Freq: Two times a day (BID) | ORAL | Status: DC
  Administered 2021-10-03 – 2021-10-04 (×2): 3 via ORAL
  Filled 2021-10-03: qty 30

## 2021-10-03 MED ORDER — HYDRALAZINE HCL 20 MG/ML IJ SOLN
10.0000 mg | Freq: Four times a day (QID) | INTRAMUSCULAR | Status: DC | PRN
Start: 2021-10-03 — End: 2021-10-04

## 2021-10-03 MED ORDER — ENOXAPARIN SODIUM 40 MG/0.4ML IJ SOSY
40.0000 mg | PREFILLED_SYRINGE | INTRAMUSCULAR | Status: DC
  Administered 2021-10-03: 40 mg via SUBCUTANEOUS
  Filled 2021-10-03: qty 0.4

## 2021-10-03 MED ORDER — GUAIFENESIN-DM 100-10 MG/5ML PO SYRP: 10.0000 mL | ORAL_SOLUTION | ORAL | Status: DC | PRN

## 2021-10-03 MED ORDER — LEVOTHYROXINE SODIUM 50 MCG PO TABS
75.0000 ug | ORAL_TABLET | Freq: Every day | ORAL | Status: DC
  Administered 2021-10-04: 75 ug via ORAL
  Filled 2021-10-03: qty 1

## 2021-10-03 MED ORDER — ACETAMINOPHEN 650 MG RE SUPP: 650.0000 mg | Freq: Four times a day (QID) | RECTAL | Status: DC | PRN

## 2021-10-03 MED ORDER — ZINC SULFATE 220 (50 ZN) MG PO CAPS
220.0000 mg | ORAL_CAPSULE | Freq: Every day | ORAL | Status: DC
  Administered 2021-10-04: 220 mg via ORAL
  Filled 2021-10-03: qty 1

## 2021-10-03 NOTE — Assessment & Plan Note (Addendum)
?   Initially held antihypertensives.  With IV fluids, blood pressure increasing.  Resume antihypertensives on discharge ?

## 2021-10-03 NOTE — ED Notes (Signed)
Patient resting comfortably on stretcher. RR even and unlabored. Provider at bedside to update her on plan to admit. Patient is unhappy with this plan but family has encouraged her to stay. Antibiotics and fluids have been started. Patient verbalized no needs or complaints at this time. ?

## 2021-10-03 NOTE — ED Notes (Signed)
Patient provided with food and beverage per her request. No other needs verbalized. ?

## 2021-10-03 NOTE — ED Provider Notes (Signed)
? ?[facility] ?Provider Note ? ? ? Event Date/Time  ? First MD Initiated Contact with Patient 10/03/21 1641   ?  (approximate) ? ? ?History  ? ?Loss of Consciousness ? ? ?HPI ? ?Isabel Myers is a 88 y.o. female presented to the ER for evaluation of generalized malaise fatigue today and syncopal episode once while walking back from bring her female in no [facility] and once in the triage room here today.  Denies any nausea or vomiting.  Felt she may be coming down with a cold denies any abdominal pain no cough.  Denies any headache.  Found herself on the ground after she had her syncopal episode at facility. ?  ? ? ?Physical Exam  ? ?Triage Vital Signs: ?ED Triage Vitals  ?Enc Vitals Group  ?   BP 10/03/21 1644 (!) 173/81  ?   Pulse Rate 10/03/21 1634 75  ?   Resp 10/03/21 1634 18  ?   Temp 10/03/21 1634 99.8 ?F (37.7 ?C)  ?   Temp Source 10/03/21 1634 Oral  ?   SpO2 10/03/21 1634 92 %  ?   Weight 10/03/21 1642 110 lb 14.3 oz (50.3 kg)  ?   Height 10/03/21 1642 5\' 1"  (1.549 m)  ?   Head Circumference --   ?   Peak Flow --   ?   Pain Score 10/03/21 1642 0  ?   Pain Loc --   ?   Pain Edu? --   ?   Excl. in GC? --   ? ? ?Most recent vital signs: ?Vitals:  ? 10/03/21 1634 10/03/21 1644  ?BP:  (!) 173/81  ?Pulse: 75 72  ?Resp: 18 18  ?Temp: 99.8 ?F (37.7 ?C) 100.2 ?F (37.9 ?C)  ?SpO2: 92% 96%  ? ? ? ?Constitutional: Alert  ?Eyes: Conjunctivae are normal.  ?Head: Atraumatic. ?Nose: No congestion/rhinnorhea. ?Mouth/Throat: Mucous membranes are moist.   ?Neck: Painless ROM.  ?Cardiovascular:   Good peripheral circulation. No m/g/r ?Respiratory: Normal respiratory effort.  No retractions. Coarse bibasilar bs ?Gastrointestinal: Soft and nontender.  ?Musculoskeletal:  no deformity ?Neurologic:  MAE spontaneously. No gross focal neurologic deficits are appreciated.  ?Skin:  Skin is warm, dry and intact. No rash noted. ?Psychiatric: Mood and affect are normal. Speech and behavior are normal. ? ? ? ?ED  Results / Procedures / Treatments  ? ?Labs ?(all labs ordered are listed, but only abnormal results are displayed) ?Labs Reviewed  ?COMPREHENSIVE METABOLIC PANEL - Abnormal; Notable for the following components:  ?    Result Value  ? Sodium 128 (*)   ? Potassium 3.2 (*)   ? Chloride 91 (*)   ? Glucose, Bld 100 (*)   ? All other components within normal limits  ?URINALYSIS, COMPLETE (UACMP) WITH MICROSCOPIC - Abnormal; Notable for the following components:  ? Color, Urine YELLOW (*)   ? APPearance CLEAR (*)   ? Ketones, ur 5 (*)   ? Leukocytes,Ua TRACE (*)   ? Bacteria, UA RARE (*)   ? All other components within normal limits  ?CBG MONITORING, ED - Abnormal; Notable for the following components:  ? Glucose-Capillary 109 (*)   ? All other components within normal limits  ?CULTURE, BLOOD (ROUTINE X 2)  ?CULTURE, BLOOD (ROUTINE X 2)  ?RESP PANEL BY RT-PCR (FLU A&B, COVID) ARPGX2  ?CBC  ?LACTIC ACID, PLASMA  ?PROTIME-INR  ?APTT  ?LACTIC ACID, PLASMA  ?TROPONIN I (HIGH SENSITIVITY)  ?TROPONIN I (HIGH SENSITIVITY)  ? ? ? ?  EKG ? ?ED ECG REPORT ?I, Willy Eddy, the attending physician, personally viewed and interpreted this ECG. ? ? Date: 10/03/2021 ? EKG Time: 16:46 ? Rate: 75 ? Rhythm: sinus ? Axis: normal ? Intervals: normal ? ST&T Change: nonspecific st abn, no stemi ? ? ? ?RADIOLOGY ?Please see ED Course for my review and interpretation. ? ?I personally reviewed all radiographic images ordered to evaluate for the above acute complaints and reviewed radiology reports and findings.  These findings were personally discussed with the patient.  Please see medical record for radiology report. ? ? ? ?PROCEDURES: ? ?Critical Care performed: Yes, see critical care procedure note(s) ? ?.Critical Care ?Performed by: Willy Eddy, MD ?Authorized by: Willy Eddy, MD  ? ?Critical care provider statement:  ?  Critical care time (minutes):  20 ?  Critical care was necessary to treat or prevent imminent or  life-threatening deterioration of the following conditions:  Metabolic crisis ?  Critical care was time spent personally by me on the following activities:  Ordering and performing treatments and interventions, ordering and review of laboratory studies, ordering and review of radiographic studies, pulse oximetry, re-evaluation of patient's condition, review of old charts, obtaining history from patient or surrogate, examination of patient, evaluation of patient's response to treatment, discussions with primary provider, discussions with consultants and development of treatment plan with patient or surrogate ? ? ?MEDICATIONS ORDERED IN ED: ?Medications  ?cefTRIAXone (ROCEPHIN) 1 g in sodium chloride 0.9 % 100 mL IVPB (1 g Intravenous New Bag/Given 10/03/21 1906)  ?sodium chloride 0.9 % bolus 500 mL (500 mLs Intravenous New Bag/Given 10/03/21 1904)  ? ? ? ?IMPRESSION / MDM / ASSESSMENT AND PLAN / ED COURSE  ?I reviewed the triage vital signs and the nursing notes. ?             ?               ? ?Differential diagnosis includes, but is not limited to, sepsis, orhtostasis, dehydration, elecctrolyte abn, pna, uti, ? ?Presenting with syncopal events as described above.  Currently well-appearing but she is febrile with oral temp of 100.2.  No focal neurodeficits.  Abdominal exam soft and benign blood work sent for above differential evaluate for sepsis. ? ?Clinical Course as of 10/03/21 1912  ?Fri Oct 03, 2021  ?1718 Chest x-ray by my review and interpretation does not show consolidation. [PR]  ?1906 No white count no anemia lactate normal.  Does have acute hyponatremia for which she will receive IV normal saline.  She is on Maxide which may be contributing to this.  Does have rare bacteria and urinary urinalysis will cover with Rocephin given her fever have added on urine culture will while awaiting blood cultures.  She is hyponatremic after syncopal episode based on her age and risk factors do believe she warrants  hospitalization for further observation monitoring.  Patient and family agreeable to plan. [PR]  ?1907 Hospitalist consulted who agreed admit patient to their service. [PR]  ?  ?Clinical Course User Index ?[PR] Willy Eddy, MD  ? ? ? ?FINAL CLINICAL IMPRESSION(S) / ED DIAGNOSES  ? ?Final diagnoses:  ?Syncope and collapse  ?Hyponatremia  ? ? ? ?Rx / DC Orders  ? ?ED Discharge Orders   ? ? None  ? ?  ? ? ? ?Note:  This document was prepared using Dragon voice recognition software and may include unintentional dictation errors. ? ?  ?Willy Eddy, MD ?10/03/21 1913 ? ?

## 2021-10-03 NOTE — Assessment & Plan Note (Addendum)
?   Incidental finding of 1.8 cm nodule in the right upper lobe ?? Chest CT however noted that this is just apical scarring. ?

## 2021-10-03 NOTE — Assessment & Plan Note (Addendum)
Likely been going on for several days.  No hypoxia.  Started on Paxlovid.  Have added zinc and vitamin C plus symptomatic care.  Will discharge patient with 4 more days of Paxlovid.  Recommend isolation and monitor for rebound COVID. ?

## 2021-10-03 NOTE — Assessment & Plan Note (Addendum)
?   Clinically dehydrated.  Started on IV fluids.  Sodium has improved from 128 to 131.  Stable.  Blood pressure now increasing. ?

## 2021-10-03 NOTE — ED Notes (Signed)
This nurse secured chat inpatient nurse to review chart. Awaiting message ?

## 2021-10-03 NOTE — Assessment & Plan Note (Signed)
.   Resume home regimen of Synthroid 

## 2021-10-03 NOTE — ED Triage Notes (Signed)
Pt comes into the ED via [facility] from home c/o dizziness today and fall x 2 days.  Pt in triage when she went bradycardic in the 40's and she had a witnessed syncopal episode by staff.  PT currently alert with even and unlabored respirations.  Pt informed staff she feels as though she hasn't felt well for the past couple days and she believes she has been running a fever at home.  Pt now able to answer all questions appropriately.  ?

## 2021-10-03 NOTE — Assessment & Plan Note (Addendum)
?   Likely orthostatic syncope considering patient's recent several day history of poor oral intake due to COVID 19 infection with clinical evidence of hypovolemia and hyponatremia in the setting of ongoing thiazide diuretic use.  Responded to IV fluids.  Not orthostatic.  Blood pressure now increasing, so we will resume antihypertensives on discharge. ?

## 2021-10-03 NOTE — Assessment & Plan Note (Addendum)
?   Likely secondary to poor oral intake and ongoing thiazide diuretic use ?? Replacing with oral potassium chloride.  Magnesium level stable. ?

## 2021-10-03 NOTE — ED Triage Notes (Signed)
First Nurse NOte:  Arrives via [facility] from home. C/O dizziness today and fall x 2 today.  No head injury.  AAOx3.  VS wnl. ?

## 2021-10-03 NOTE — H&P (Signed)
?History and Physical  ? ? ?Patient: Isabel GrumblesKathryn E Cifelli MRN: [id number] DOA: 10/03/2021 ? ?Date of Service: the patient was seen and examined on 10/03/2021 ? ?Patient coming from: ALF/ILF ? ?Chief Complaint:  ?Chief Complaint  ?Patient presents with  ? Loss of Consciousness  ? ? ?HPI:  ? ?88 year old female with past medical history of hypertension, hypothyroidism chronic low back pain, who [facility] department from [facility] assisted living facility via EMS with complaints of dizziness and an episode of loss of consciousness. ? ?Patient explains that she was walking to her mailbox outside to get her mail and upon walking back into the building she experienced an episode of "dizziness" and loss of consciousness.  Patient further describes the dizziness as lightheadedness.  Patient states that the last thing she remembers is waking up on the ground.  Patient denies palpitations, chest pain, tongue biting, self urination or defecation. ? ?Of note, patient reports that in the past several days she has been experiencing generalized malaise and fatigue as well as poor oral intake and myalgias.  Patient feels that she "may be coming down with a cold." ? ?Due to the episode of loss of consciousness EMS was contacted who promptly came to evaluate the patient and brought [facility] emergency department for evaluation. ? ?Shortly after arrival to the emergency department patient experienced a another episode of witnessed loss of consciousness, presumed to be syncope.  Patient was administered a 500 cc normal saline bolus.  Patient was noted to have a low-grade fever of 100.2 ?F in the emergency department and due to is a suspected urinary tract infection was administered 1 g of intravenous ceftriaxone.  Patient was noted to be hypokalemic at 3.2 and hyponatremic at 128 and was felt to be volume depleted on exam.  The hospitalist group was then called to assess the patient for admission to the  hospital. ? ?Review of Systems: Review of Systems  ?Constitutional:  Positive for malaise/fatigue.  ?Neurological:  Positive for loss of consciousness and weakness.  ?All other systems reviewed and are negative. ? ? ?Past Medical History:  ?Diagnosis Date  ? Hypertension   ? Thyroid disease   ? hypothyroid  ? ? ?Past Surgical History:  ?Procedure Laterality Date  ? ABDOMINAL HYSTERECTOMY  1987  ? with BSO  ? Bladder tack  2009  ? Dr. Chrissie Noaefranchesco  ? BUNIONECTOMY    ? CATARACT EXTRACTION, BILATERAL    ? CT Cervical Spine  10/22/2011  ? negative  ? CT Scan of head  10/22/2011  ? without contrast, [facility], Normal  ? THYROIDECTOMY  1937  ? SUBTOTAL  ? TONSILLECTOMY AND ADENOIDECTOMY    ? ? ?Social History:  reports that she has quit smoking. Her smoking use included cigarettes. She has a 7.50 pack-year smoking history. She has never used smokeless tobacco. She reports current alcohol use of about 7.0 standard drinks per week. She reports that she does not use drugs. ? ?Allergies  ?Allergen Reactions  ? Amlodipine   ?  Too much swelling on 2.5mg  daily  ? ? ?Family History  ?Problem Relation Age of Onset  ? Memory loss Sister   ?     short term memory loss  ? ? ?Prior to Admission medications   ?Medication Sig Start Date End Date Taking? Authorizing Provider  ?amLODipine (NORVASC) 2.5 MG tablet Take 1 tablet (2.5 mg total) by mouth daily. 03/18/21   Malva LimesFisher, Donald E, MD  ?Calcium Carbonate (CALCIUM 600 PO) Take  1 tablet by mouth daily. 10/26/08   [provider]  ?levothyroxine (SYNTHROID) 75 MCG tablet TAKE 1 TABLET EVERY DAY ON EMPTY STOMACHWITH A GLASS OF WATER AT LEAST 30-60 MINBEFORE BREAKFAST 09/12/21   [name], MD  ?Multiple Vitamins-Minerals (PRESERVISION AREDS 2 PO) Take 2 tablets by mouth daily at 6 (six) AM.    [provider]  ?triamterene-hydrochlorothiazide (MAXZIDE) 75-50 MG tablet TAKE 1 TABLET BY MOUTH DAILY 03/18/21   [name], MD  ?triazolam (HALCION) 0.25 MG tablet Take 0.5-1  tablets (0.125-0.25 mg total) by mouth at bedtime as needed. 08/15/18   [name], MD  ? ? ?Physical Exam: ? ?Vitals:  ? 10/03/21 1642 10/03/21 1644 10/03/21 1900 10/03/21 1930  ?BP:  (!) 173/81 (!) 155/85 (!) 146/52  ?Pulse:  72 78 74  ?Resp:  18 18 18   ?Temp:  100.2 ?F (37.9 ?C)  99.4 ?F (37.4 ?C)  ?TempSrc:  Oral  Oral  ?SpO2:  96% 96% 93%  ?Weight: 50.3 kg     ?Height: 5\' 1"  (1.549 m)     ? ? ?Constitutional: Awake alert and oriented x3, no associated distress.   ?Skin: no rashes, no lesions, poor skin turgor noted. ?Eyes: Pupils are equally reactive to light.  No evidence of scleral icterus or conjunctival pallor.  ?ENMT: dry mucous membranes noted.  Posterior pharynx clear of any exudate or lesions.   ?Neck: normal, supple, no masses, no thyromegaly.  No evidence of jugular venous distension.   ?Respiratory: clear to auscultation bilaterally, no wheezing, no crackles. Normal respiratory effort. No accessory muscle use.  ?Cardiovascular: Regular rate and rhythm, no murmurs / rubs / gallops. No extremity edema. 2+ pedal pulses. No carotid bruits.  ?Chest:   Nontender without crepitus or deformity.   ?Back:   Nontender without crepitus or deformity. ?Abdomen: Abdomen is soft and nontender.  No evidence of intra-abdominal masses.  Positive bowel sounds noted in all quadrants.   ?Musculoskeletal: No joint deformity upper and lower extremities. Good ROM, no contractures. Normal muscle tone.  ?Neurologic: CN 2-12 grossly intact. Sensation intact.  Patient moving all 4 extremities spontaneously.  Patient is following all commands.  Patient is responsive to verbal stimuli.   ?Psychiatric: Patient exhibits normal mood with appropriate affect.  Patient seems to possess insight as to their current situation.   ? ?Data Reviewed: ? ?I have personally reviewed and interpreted labs, imaging. ? ?Significant findings are sodium 128, potassium 3.2, chloride 91 troponin 8 CBC revealing white blood cell count of 9.3.  Lactic  acid found to be 0.9. ? ?Chest x-ray personally reviewed revealing a 1.8 cm right upper lobe pulmonary nodule but otherwise no evidence of acute cardiopulmonary disease. ? ?CT imaging of the head without contrast revealing no evidence of acute intracranial abnormality. ? ?EKG: Personally reviewed.  Rhythm is normal sinus rhythm with heart rate of 77 bpm.  No dynamic ST segment changes appreciated. ? ? ?Assessment and Plan: ?* Syncope ?Likely orthostatic syncope considering patient's recent several day history of poor oral intake due to COVID 19 infection with clinical evidence of hypovolemia and hyponatremia in the setting of ongoing thiazide diuretic use ?Hydrating patient with intravenous isotonic fluids ?Obtaining orthostatic vital signs ?Echocardiogram in the morning ?Holding triamterene/hydrochlorothiazide ?Monitoring patient on telemetry ?Cardiac enzymes unremarkable ? ?Dehydration with hyponatremia ?Patient appears clinically volume depleted ?Patient reports a several day history of myalgias weakness and poor oral intake likely secondary to COVID 19 ?Volume status exacerbated by ongoing thiazide diuretic  use ?Hydrating patient with intravenous isotonic fluids ?Monitoring sodium levels with serial chemistries ? ?COVID-19 virus infection ?Patient reports several day history of myalgias weakness and poor oral intake ?She presents now with low-grade fever of 100.2 ?F ?COVID-19 PCR testing positive aortic  ?Chest x-ray unremarkable. No evidence of hypoxia ?Will place patient on 5 day course of Paxlovid ?Zinc and vitamin C supplementation ?PRN antitussives ?PRN bronchodilator via MDI ? ? ?Hypokalemia ?Likely secondary to poor oral intake and ongoing thiazide diuretic use ?Replacing with oral potassium chloride ?Monitoring potassium and magnesium levels with serial chemistries ? ?Nodule of upper lobe of right lung ?Incidental finding of 1.8 cm nodule in the right upper lobe ?Will proceed with CT imaging per  radiology recommendations ? ?Essential hypertension ?Holding home regimen of antihypertensives for now including home regimen of triamterene and hydrochlorothiazide ?As needed intravenous antihypertensives for markedly elevate

## 2021-10-04 ENCOUNTER — Observation Stay: Admit: 2021-10-04 | Payer: PPO

## 2021-10-04 LAB — CBC WITH DIFFERENTIAL/PLATELET
Abs Immature Granulocytes: 0.03 10*3/uL (ref 0.00–0.07)
Basophils Absolute: 0 10*3/uL (ref 0.0–0.1)
Basophils Relative: 1 %
Eosinophils Absolute: 0 10*3/uL (ref 0.0–0.5)
Eosinophils Relative: 0 %
HCT: 34.7 % — ABNORMAL LOW (ref 36.0–46.0)
Hemoglobin: 11.7 g/dL — ABNORMAL LOW (ref 12.0–15.0)
Immature Granulocytes: 0 %
Lymphocytes Relative: 14 %
Lymphs Abs: 1.1 10*3/uL (ref 0.7–4.0)
MCH: 29.4 pg (ref 26.0–34.0)
MCHC: 33.7 g/dL (ref 30.0–36.0)
MCV: 87.2 fL (ref 80.0–100.0)
Monocytes Absolute: 1.3 10*3/uL — ABNORMAL HIGH (ref 0.1–1.0)
Monocytes Relative: 17 %
Neutro Abs: 5.2 10*3/uL (ref 1.7–7.7)
Neutrophils Relative %: 68 %
Platelets: 225 10*3/uL (ref 150–400)
RBC: 3.98 MIL/uL (ref 3.87–5.11)
RDW: 14.3 % (ref 11.5–15.5)
WBC: 7.6 10*3/uL (ref 4.0–10.5)
nRBC: 0 % (ref 0.0–0.2)

## 2021-10-04 LAB — COMPREHENSIVE METABOLIC PANEL
ALT: 16 U/L (ref 0–44)
AST: 25 U/L (ref 15–41)
Albumin: 3.3 g/dL — ABNORMAL LOW (ref 3.5–5.0)
Alkaline Phosphatase: 40 U/L (ref 38–126)
Anion gap: 8 (ref 5–15)
BUN: 14 mg/dL (ref 8–23)
CO2: 27 mmol/L (ref 22–32)
Calcium: 8.5 mg/dL — ABNORMAL LOW (ref 8.9–10.3)
Chloride: 96 mmol/L — ABNORMAL LOW (ref 98–111)
Creatinine, Ser: 0.7 mg/dL (ref 0.44–1.00)
GFR, Estimated: >60 mL/min (ref >60)
Glucose, Bld: 89 mg/dL (ref 70–99)
Potassium: 3.1 mmol/L — ABNORMAL LOW (ref 3.5–5.1)
Sodium: 131 mmol/L — ABNORMAL LOW (ref 135–145)
Total Bilirubin: 0.8 mg/dL (ref 0.3–1.2)
Total Protein: 6.1 g/dL — ABNORMAL LOW (ref 6.5–8.1)

## 2021-10-04 LAB — MAGNESIUM: Magnesium: 1.9 mg/dL (ref 1.7–2.4)

## 2021-10-04 LAB — C-REACTIVE PROTEIN: CRP: 1.2 mg/dL — ABNORMAL HIGH (ref <1.0)

## 2021-10-04 MED ORDER — NIRMATRELVIR/RITONAVIR (PAXLOVID)TABLET: 3.0000 | ORAL_TABLET | Freq: Two times a day (BID) | ORAL | 0 refills | Status: AC

## 2021-10-04 NOTE — Plan of Care (Signed)

## 2021-10-04 NOTE — Progress Notes (Signed)
Pt's daughter, Benjamin Stain, present for pt discharge; verbally reviewed AVS with pt and her daughter, no questions voiced at this time; pt discharged via wheelchair by nursing to the [facility] entrance ?

## 2021-10-04 NOTE — Hospital Course (Addendum)
88 year old female with past medical history of hypertension, hypothyroidism and chronic low back pain who presented to the emergency room on 3/24 from her assisted living facility after an episode of syncope as well as complaints of dizziness.  In the emergency room, patient found to have COVID as well as be clinically dehydrated and mildly hyponatremic.  Patient brought into the hospitalist service and started on IV fluids and Paxlovid. ? ?By following day, patient feeling better.  Sodium had increased from 128 to 131.  Patient able to ambulate without any dizziness. ?

## 2021-10-04 NOTE — Discharge Summary (Signed)
?Physician Discharge Summary ?  ?Patient: Isabel Myers MRN: [id number] DOB: 05-17-1930  ?Admit date:     10/03/2021  ?Discharge date: 10/04/21  ?Discharge Physician: Annita Brod  ? ?PCP: Birdie Sons, MD  ? ?Recommendations at discharge:  ? ?New medication: Paxlovid twice daily x4 days ?Patient needs to isolate for the next 8 days.  Monitor for rebound COVID for 8 days after completion of Paxlovid ? ?Discharge Diagnoses: ?Principal Problem: ?  Syncope ?Active Problems: ?  Dehydration with hyponatremia ?  COVID-19 virus infection ?  Hypokalemia ?  Nodule of upper lobe of right lung ?  Essential hypertension ?  Hypothyroidism ? ?Resolved Problems: ?  * No resolved hospital problems. * ? ?Hospital Course: ?88 year old female with past medical history of hypertension, hypothyroidism and chronic low back pain who presented to the emergency room on 3/24 from her assisted living facility after an episode of syncope as well as complaints of dizziness.  In the emergency room, patient found to have COVID as well as be clinically dehydrated and mildly hyponatremic.  Patient brought into the hospitalist service and started on IV fluids and Paxlovid. ? ?By following day, patient feeling better.  Sodium had increased from 128 to 131.  Patient able to ambulate without any dizziness. ? ?Assessment and Plan: ?* Syncope ?Likely orthostatic syncope considering patient's recent several day history of poor oral intake due to COVID 19 infection with clinical evidence of hypovolemia and hyponatremia in the setting of ongoing thiazide diuretic use.  Responded to IV fluids.  Not orthostatic.  Blood pressure now increasing, so we will resume antihypertensives on discharge. ? ?Dehydration with hyponatremia ?Clinically dehydrated.  Started on IV fluids.  Sodium has improved from 128 to 131.  Stable.  Blood pressure now increasing. ? ?COVID-19 virus infection ?Likely been going on for several days.  No hypoxia.  Started on Paxlovid.   Have added zinc and vitamin C plus symptomatic care.  Will discharge patient with 4 more days of Paxlovid.  Recommend isolation and monitor for rebound COVID. ? ?Hypokalemia ?Likely secondary to poor oral intake and ongoing thiazide diuretic use ?Replacing with oral potassium chloride.  Magnesium level stable. ? ?Nodule of upper lobe of right lung ?Incidental finding of 1.8 cm nodule in the right upper lobe ?Chest CT however noted that this is just apical scarring. ? ?Essential hypertension ?Initially held antihypertensives.  With IV fluids, blood pressure increasing.  Resume antihypertensives on discharge ? ?Hypothyroidism ?Resume home regimen of Synthroid ? ? ? ? ? ? ?  ? ? ?Consultants: None ?Procedures performed: Echocardiogram: Results pending ?Disposition: Home ?Diet recommendation:  ?Discharge Diet Orders (From admission, onward)  ? ?  Start     Ordered  ? 10/04/21 0000  Diet - low sodium heart healthy       ? 10/04/21 1324  ? ?  ?  ? ?  ? ?Cardiac diet ?DISCHARGE MEDICATION: ?Allergies as of 10/04/2021   ? ?   Reactions  ? Amlodipine   ? Too much swelling on 2.5mg  daily  ? ?  ? ?  ?Medication List  ?  ? ?TAKE these medications   ? ?amLODipine 2.5 MG tablet ?Commonly known as: NORVASC ?Take 1 tablet (2.5 mg total) by mouth daily. ?  ?CALCIUM 600 PO ?Take 1 tablet by mouth daily. ?  ?levothyroxine 75 MCG tablet ?Commonly known as: SYNTHROID ?TAKE 1 TABLET EVERY DAY ON EMPTY STOMACHWITH A GLASS OF WATER AT LEAST 30-60 MINBEFORE BREAKFAST ?  ?nirmatrelvir/ritonavir  EUA 20 x 150 MG & 10 x 100MG  Tabs ?Commonly known as: PAXLOVID ?Take 3 tablets by mouth 2 (two) times daily for 5 days. Patient GFR is >60. Take nirmatrelvir (150 mg) two tablets twice daily for 4 days and ritonavir (100 mg) one tablet twice daily for 4 days. ?(Already received 2 doses in hospital) ?  ?PRESERVISION AREDS 2 PO ?Take 2 tablets by mouth daily at 6 (six) AM. ?  ?triamterene-hydrochlorothiazide 75-50 MG tablet ?Commonly known as:  MAXZIDE ?TAKE 1 TABLET BY MOUTH DAILY ?  ? ?  ? ? ?Discharge Exam: ?Filed Weights  ? 10/03/21 1642 10/04/21 [id number]  ?Weight: 50.3 kg 50.6 kg  ? ?General: Alert and oriented x3, no acute distress ?Cardiovascular: Regular rate and rhythm, S1-S2 ?Lungs: Clear to auscultation bilaterally ? ?Condition at discharge: good ? ?The results of significant diagnostics from this hospitalization (including imaging, microbiology, ancillary and laboratory) are listed below for reference.  ? ?Imaging Studies: ?CT HEAD WO CONTRAST (5MM) ? ?Result Date: 10/03/2021 ?CLINICAL DATA:  Fall.  Blunt head trauma.  Dizziness.  Neck pain. EXAM: CT HEAD WITHOUT CONTRAST CT CERVICAL SPINE WITHOUT CONTRAST TECHNIQUE: Multidetector CT imaging of the head and cervical spine was performed following the standard protocol without intravenous contrast. Multiplanar CT image reconstructions of the cervical spine were also generated. RADIATION DOSE REDUCTION: This exam was performed according to the departmental dose-optimization program which includes automated exposure control, adjustment of the mA and/or kV according to patient size and/or use of iterative reconstruction technique. COMPARISON:  10/22/2011 FINDINGS: CT HEAD FINDINGS Brain: No evidence of intracranial hemorrhage, acute infarction, hydrocephalus, extra-axial collection, or mass lesion/mass effect. Mild diffuse cerebral atrophy again noted Vascular:  No hyperdense vessel or other acute findings. Skull: No evidence of fracture or other significant bone abnormality. Sinuses/Orbits:  No acute findings. Other: None. CT CERVICAL SPINE FINDINGS Alignment: Normal. Increased cervical kyphosis is seen since prior study, which may be positional or due to muscle spasm. Skull base and vertebrae: No acute fracture. No primary bone lesion or focal pathologic process. Soft tissues and spinal canal: No prevertebral fluid or swelling. No visible canal hematoma. Disc levels: Severe degenerative disc disease is  again seen at C5-6 and mild-to-moderate degenerative disc disease is seen at C6-7. Upper chest: No acute findings. Other: None. IMPRESSION: No acute intracranial abnormality.  Mild cerebral atrophy. No evidence of acute cervical spine fracture or subluxation. Cervical kyphosis, which may be positional or due to muscle spasm. Suggest clinical correlation. Degenerative spondylosis, as described above. Electronically Signed   By: Marlaine Hind M.D.   On: 10/03/2021 17:36  ? ?CT CHEST W CONTRAST ? ?Result Date: 10/03/2021 ?CLINICAL DATA:  Abnormal chest radiograph.  Pulmonary nodule. EXAM: CT CHEST WITH CONTRAST TECHNIQUE: Multidetector CT imaging of the chest was performed during intravenous contrast administration. RADIATION DOSE REDUCTION: This exam was performed according to the departmental dose-optimization program which includes automated exposure control, adjustment of the mA and/or kV according to patient size and/or use of iterative reconstruction technique. CONTRAST:  31mL OMNIPAQUE IOHEXOL 300 MG/ML  SOLN COMPARISON:  Chest radiograph dated 10/03/2021. FINDINGS: Cardiovascular: No cardiomegaly or pericardial effusion. There is moderate atherosclerotic calcification of the thoracic aorta. No aneurysmal dilatation or dissection. The origins of the great vessels of the aortic arch appear patent as visualized. No pulmonary artery embolus identified. Mediastinum/Nodes: There is no hilar or mediastinal adenopathy. The esophagus is grossly unremarkable. No mediastinal fluid collection. Lungs/Pleura: Bibasilar and biapical subpleural scarring. No focal consolidation, pleural effusion, or  pneumothorax. No discrete pulmonary nodule identified. The nodular density seen on the earlier radiograph corresponds to the apical scarring. The central airways are patent. Upper Abdomen: Cirrhosis. Musculoskeletal: Osteopenia with scoliosis and degenerative changes. No acute osseous pathology. IMPRESSION: 1. No acute intrathoracic  pathology. No discrete pulmonary nodule identified. 2. Cirrhosis. 3. Aortic Atherosclerosis (ICD10-I70.0). Electronically Signed   By: Anner Crete M.D.   On: 10/03/2021 21:41  ? ?CT Cervical Spine Wo Con

## 2021-10-04 NOTE — Progress Notes (Signed)
MD order received in [facility] to discharge pt home today; pt verbalized that her daughter will be coming to take her home and could the review of the discharge instructions wait until she gets here; discharge instructions pending arrival of the pt's daughter in order to review with the pt and her daughter ?

## 2021-10-06 ENCOUNTER — Ambulatory Visit: Payer: Self-pay | Admitting: *Deleted

## 2021-10-06 NOTE — Telephone Encounter (Signed)
?  Chief Complaint: can travel ?Symptoms: resolving ?Frequency: na ?Pertinent Negatives: Patient denies resp difficulty or fever ?Disposition: [] ED /[] Urgent Care (no appt availability in office) / [] Appointment(In office/virtual)/ []  [facility]/ [] Home Care/ [] Refused Recommended Disposition /[] [facility]/ [x]  Follow-up with PCP ?Additional Notes: Wants to travel at [place]. Explained it is good to travel then. ? ?Reason for Disposition ? COVID-19 Disease, questions about ? ?Answer Assessment - Initial Assessment Questions ?1. COVID-19 DIAGNOSIS: "Who made your COVID-19 diagnosis?" "Was it confirmed by a positive lab test or self-test?" If not diagnosed by a doctor (or NP/PA), ask "Are there lots of cases (community spread) where you live?" Note: See public health department website, if unsure. ?    md ?2. COVID-19 EXPOSURE: "Was there any known exposure to COVID before the symptoms began?" CDC Definition of close contact: within 6 feet (2 meters) for a total of 15 minutes or more over a 24-hour period.  ?    na ?3. ONSET: "When did the COVID-19 symptoms start?"  ?    Thursday 10/02/21 ?4. WORST SYMPTOM: "What is your worst symptom?" (e.g., cough, fever, shortness of breath, muscle aches) ?    Brain foggy ?5. COUGH: "Do you have a cough?" If Yes, ask: "How bad is the cough?"   ?    no ?6. FEVER: "Do you have a fever?" If Yes, ask: "What is your temperature, how was it measured, and when did it start?" ?    No feer ?7. RESPIRATORY STATUS: "Describe your breathing?" (e.g., shortness of breath, wheezing, unable to speak)  ?    No problems ?8. BETTER-SAME-WORSE: "Are you getting better, staying the same or getting worse compared to yesterday?"  If getting worse, ask, "In what way?" ?    better ? ?Protocols used: Coronavirus ([id number]) Diagnosed or Suspected-A-AH ? ?

## 2021-10-08 LAB — CULTURE, BLOOD (ROUTINE X 2)
Culture: NO GROWTH
Culture: NO GROWTH

## 2021-10-21 DIAGNOSIS — I7091 Generalized atherosclerosis: Secondary | ICD-10-CM | POA: Diagnosis not present

## 2021-10-21 DIAGNOSIS — B351 Tinea unguium: Secondary | ICD-10-CM | POA: Diagnosis not present

## 2021-10-21 DIAGNOSIS — L84 Corns and callosities: Secondary | ICD-10-CM | POA: Diagnosis not present

## 2021-10-27 ENCOUNTER — Encounter: Payer: Self-pay | Admitting: Family Medicine

## 2021-10-27 ENCOUNTER — Ambulatory Visit (INDEPENDENT_AMBULATORY_CARE_PROVIDER_SITE_OTHER): Payer: PPO | Admitting: Family Medicine

## 2021-10-27 VITALS — BP 164/57 | HR 64 | Temp 97.8°F | Resp 16 | Wt 108.0 lb

## 2021-10-27 DIAGNOSIS — E876 Hypokalemia: Secondary | ICD-10-CM | POA: Diagnosis not present

## 2021-10-27 DIAGNOSIS — E871 Hypo-osmolality and hyponatremia: Secondary | ICD-10-CM | POA: Diagnosis not present

## 2021-10-27 NOTE — Progress Notes (Signed)
?  ? ? ?I,Roshena L Chambers,acting as a scribe for Mila Merry, MD.,have documented all relevant documentation on the behalf of Mila Merry, MD,as directed by  Mila Merry, MD while in the presence of Mila Merry, MD.  ? ?Established patient visit ? ? ?Patient: Isabel Myers   DOB: 11-13-1929   88 y.o. Female  MRN: [id number] ?Visit Date: 10/27/2021 ? ?Today's healthcare provider: Mila Merry, MD  ? ?Chief Complaint  ?Patient presents with  ? Hospitalization Follow-up  ? ?Subjective  ?  ?HPI  ?Follow up Hospitalization ? ?Patient was admitted to [facility] on 10/03/2021 and discharged on 10/04/2021. ?She was treated for syncope and collapse, COVID infection, dehydration, hyponatremia and hypokalemia. ?Treatment for this included IV fluids and Paxlovid. ?Telephone follow up was done on 10/06/2021 ?She reports good compliance with treatment. ?She reports this condition is improved. Patient still feels fatigued.  ? ?-----------------------------------------------------------------------------------------  ? ?Medications: ?Outpatient Medications Prior to Visit  ?Medication Sig  ? amLODipine (NORVASC) 2.5 MG tablet Take 1 tablet (2.5 mg total) by mouth daily.  ? Calcium Carbonate (CALCIUM 600 PO) Take 1 tablet by mouth daily.  ? levothyroxine (SYNTHROID) 75 MCG tablet TAKE 1 TABLET EVERY DAY ON EMPTY STOMACHWITH A GLASS OF WATER AT LEAST 30-60 MINBEFORE BREAKFAST  ? Multiple Vitamins-Minerals (PRESERVISION AREDS 2 PO) Take 2 tablets by mouth daily at 6 (six) AM.  ? triamterene-hydrochlorothiazide (MAXZIDE) 75-50 MG tablet TAKE 1 TABLET BY MOUTH DAILY  ? ?No facility-administered medications prior to visit.  ? ? ?Review of Systems  ?Constitutional:  Positive for fatigue. Negative for appetite change, chills and fever.  ?Respiratory:  Negative for chest tightness and shortness of breath.   ?Cardiovascular:  Negative for chest pain and palpitations.  ?Gastrointestinal:  Negative for abdominal pain, nausea and  vomiting.  ?Neurological:  Negative for dizziness and weakness.  ? ? ?  Objective  ?  ?BP (!) 164/57 (BP Location: Right Arm, Patient Position: Sitting, Cuff Size: Normal)   Pulse 64   Temp 97.8 ?F (36.6 ?C) (Oral)   Resp 16   Wt 108 lb (49 kg)   SpO2 98% Comment: room air  BMI 20.41 kg/m?  ? ?Today's Vitals  ? 10/27/21 1545 10/27/21 1551  ?BP: (!) 151/70 (!) 164/57  ?Pulse: 64   ?Resp: 16   ?Temp: 97.8 ?F (36.6 ?C)   ?TempSrc: Oral   ?SpO2: 98%   ?Weight: 108 lb (49 kg)   ? ?Body mass index is 20.41 kg/m?.  ? ? ?Physical Exam  ? ? ?General: Appearance:    Well developed, well nourished female in no acute distress  ?Eyes:    PERRL, conjunctiva/corneas clear, EOM's intact       ?Lungs:     Clear to auscultation bilaterally, respirations unlabored  ?Heart:    Normal heart rate. Normal rhythm. No murmurs, rubs, or gallops.    ?MS:   All extremities are intact.    ?Neurologic:   Awake, alert, oriented x 3. No apparent focal neurological defect.   ?   ? ? ? ? Assessment & Plan  ?  ? ?1. Hyponatremia ? ?Likely secondary to Covid which has now resolved.  ?- Renal function panel ? ?2. Hypokalemia ? ?- Renal function panel  ?  ?   ? ?The entirety of the information documented in the History of Present Illness, Review of Systems and Physical Exam were personally obtained by me. Portions of this information were initially documented by the CMA and reviewed by me  for thoroughness and accuracy.   ? ? ?Mila Merry, MD  ?[facility] ?[phone] (phone) ?[phone] (fax) ? ?[facility]  ?

## 2021-10-27 NOTE — Patient Instructions (Signed)
.   Please review the attached list of medications and notify my office if there are any errors.   . Please bring all of your medications to every appointment so we can make sure that our medication list is the same as yours.   

## 2021-10-28 LAB — RENAL FUNCTION PANEL
Albumin: 4.7 g/dL — ABNORMAL HIGH (ref 3.6–4.6)
BUN/Creatinine Ratio: 18 (ref 12–28)
BUN: 14 mg/dL (ref 8–27)
CO2: 24 mmol/L (ref 20–29)
Calcium: 10 mg/dL (ref 8.7–10.3)
Chloride: 92 mmol/L — ABNORMAL LOW (ref 96–106)
Creatinine, Ser: 0.8 mg/dL (ref 0.57–1.00)
Glucose: 89 mg/dL (ref 70–99)
Phosphorus: 3.3 mg/dL (ref 3.0–4.3)
Potassium: 3.7 mmol/L (ref 3.5–5.2)
Sodium: 132 mmol/L — ABNORMAL LOW (ref 134–144)
eGFR: 70 mL/min/{1.73_m2} (ref >59)

## 2021-11-07 ENCOUNTER — Ambulatory Visit: Payer: Self-pay | Admitting: *Deleted

## 2021-11-07 NOTE — Telephone Encounter (Signed)
Pt given lab results per notes of Dr. Caryn Section from 10/28/21 on 11/07/21. Pt verbalized understanding to continue current medications.  ?

## 2021-11-13 ENCOUNTER — Other Ambulatory Visit: Payer: Self-pay | Admitting: Family Medicine

## 2022-01-26 ENCOUNTER — Ambulatory Visit: Payer: PPO | Admitting: Family Medicine

## 2022-01-26 ENCOUNTER — Ambulatory Visit (INDEPENDENT_AMBULATORY_CARE_PROVIDER_SITE_OTHER): Payer: PPO | Admitting: Physician Assistant

## 2022-01-26 ENCOUNTER — Encounter: Payer: Self-pay | Admitting: Physician Assistant

## 2022-01-26 VITALS — BP 130/80 | HR 73 | Temp 97.8°F | Resp 16 | Wt 105.0 lb

## 2022-01-26 DIAGNOSIS — I10 Essential (primary) hypertension: Secondary | ICD-10-CM

## 2022-01-26 NOTE — Progress Notes (Signed)
I,Nataline Basara Robinson,acting as a Education administrator for [employer], PA-C.,have documented all relevant documentation on the behalf of Mardene Speak, PA-C,as directed by  [employer], PA-C while in the presence of [employer], PA-C.   Established patient visit   Patient: Isabel Myers   DOB: Jul 11, 1930   88 y.o. Female  MRN: [id number] Visit Date: 01/26/2022  Today's healthcare provider: Mardene Speak, PA-C   Chief Complaint  Patient presents with   Hypertension   Subjective    Hypertension, follow-up  BP Readings from Last 3 Encounters:  01/26/22 130/80  10/27/21 (!) 164/57  10/04/21 (!) 167/62   Wt Readings from Last 3 Encounters:  01/26/22 105 lb (47.6 kg)  10/27/21 108 lb (49 kg)  10/04/21 111 lb 8.8 oz (50.6 kg)     She was last seen for hypertension 10 months ago.  BP at that visit was 132/50. Management since that visit includes: Doing well with addition of low dose amlodipine to high dose triamterene-hctz. Continue current medications. She reports excellent compliance with treatment. She is not having side effects. She is following a Regular diet. She is not exercising. She does not smoke.  Use of agents associated with hypertension: none.   Outside blood pressures are  Symptoms: No chest pain No chest pressure  No palpitations No syncope  No dyspnea No orthopnea  No paroxysmal nocturnal dyspnea No lower extremity edema   Pertinent labs Lab Results  Component Value Date   CHOL 185 09/12/2019   HDL 81 09/12/2019   LDLCALC 93 09/12/2019   TRIG 55 09/12/2019   CHOLHDL 2.3 09/12/2019   Lab Results  Component Value Date   NA 132 (L) 10/27/2021   K 3.7 10/27/2021   CREATININE 0.80 10/27/2021   EGFR 70 10/27/2021   GLUCOSE 89 10/27/2021   TSH 1.340 09/17/2020     The ASCVD Risk score (Arnett DK, et al., 2019) failed to calculate for the following reasons:   The 2019 ASCVD risk score is only valid for ages 2 to  71  ---------------------------------------------------------------------------------------------------   Medications: Outpatient Medications Prior to Visit  Medication Sig   amLODipine (NORVASC) 2.5 MG tablet Take 1 tablet (2.5 mg total) by mouth daily.   Calcium Carbonate (CALCIUM 600 PO) Take 1 tablet by mouth daily.   levothyroxine (SYNTHROID) 75 MCG tablet TAKE 1 TABLET EVERY DAY ON EMPTY STOMACHWITH A GLASS OF WATER AT LEAST 30-60 MINBEFORE BREAKFAST   Multiple Vitamins-Minerals (PRESERVISION AREDS 2 PO) Take 2 tablets by mouth daily at 6 (six) AM.   triamterene-hydrochlorothiazide (MAXZIDE) 75-50 MG tablet TAKE 1 TABLET BY MOUTH DAILY   No facility-administered medications prior to visit.    Review of Systems  Constitutional:  Negative for appetite change, chills, fatigue and fever.  Respiratory:  Negative for chest tightness and shortness of breath.   Cardiovascular:  Negative for chest pain and palpitations.  Gastrointestinal:  Negative for abdominal pain, nausea and vomiting.  Neurological:  Negative for dizziness and weakness.       Objective    BP 130/80 (BP Location: Right Arm, Patient Position: Sitting, Cuff Size: Normal)   Pulse 73   Temp 97.8 F (36.6 C) (Oral)   Resp 16   Wt 105 lb (47.6 kg)   SpO2 98%   BMI 19.84 kg/m    Physical Exam Vitals reviewed.  Constitutional:      General: She is not in acute distress.    Appearance: Normal appearance. She is well-developed. She is not  diaphoretic.  HENT:     Head: Normocephalic and atraumatic.  Eyes:     General: No scleral icterus.    Extraocular Movements: Extraocular movements intact.     Conjunctiva/sclera: Conjunctivae normal.     Pupils: Pupils are equal, round, and reactive to light.  Neck:     Thyroid: No thyromegaly.  Cardiovascular:     Rate and Rhythm: Normal rate and regular rhythm.     Pulses: Normal pulses.     Heart sounds: Normal heart sounds. No murmur heard. Pulmonary:     Effort:  Pulmonary effort is normal. No respiratory distress.     Breath sounds: Normal breath sounds. No wheezing, rhonchi or rales.  Abdominal:     General: Abdomen is flat. Bowel sounds are normal.     Palpations: Abdomen is soft.  Musculoskeletal:     Cervical back: Normal range of motion and neck supple.     Right lower leg: No edema.     Left lower leg: No edema.  Lymphadenopathy:     Cervical: No cervical adenopathy.  Skin:    General: Skin is warm and dry.     Findings: No rash.  Neurological:     Mental Status: She is alert and oriented to person, place, and time. Mental status is at baseline.  Psychiatric:        Behavior: Behavior normal.        Thought Content: Thought content normal.        Judgment: Judgment normal.       No results found for any visits on 01/26/22.  Assessment & Plan     1. Essential (primary) hypertension BP today was 130/80 second reading Continue her medications Measure her BP at home and bring her BP log with her to the next appt. Her current BP cuff is not working. She planned to check her device with pharmacy.  Prefers not to do labs today  Fu in 3 mo with her PCP    The patient was advised to call back or seek an in-person evaluation if the symptoms worsen or if the condition fails to improve as anticipated.  I discussed the assessment and treatment plan with the patient. The patient was provided an opportunity to ask questions and all were answered. The patient agreed with the plan and demonstrated an understanding of the instructions.  The entirety of the information documented in the History of Present Illness, Review of Systems and Physical Exam were personally obtained by me. Portions of this information were initially documented by the CMA and reviewed by me for thoroughness and accuracy.  Portions of this note were created using dictation software and may contain typographical errors.     Total encounter time more than 20 minutes   Greater than 50% was spent in counseling and coordination of care with the patient   Elberta Leatherwood  [facility] [phone] (phone) [phone] (fax)  [place]

## 2022-02-03 DIAGNOSIS — I7091 Generalized atherosclerosis: Secondary | ICD-10-CM | POA: Diagnosis not present

## 2022-02-03 DIAGNOSIS — B351 Tinea unguium: Secondary | ICD-10-CM | POA: Diagnosis not present

## 2022-03-13 ENCOUNTER — Other Ambulatory Visit: Payer: Self-pay | Admitting: Family Medicine

## 2022-03-13 DIAGNOSIS — I10 Essential (primary) hypertension: Secondary | ICD-10-CM

## 2022-04-27 NOTE — Progress Notes (Unsigned)
Ar[place]ina Ponder [place],acting as a scribe for Lelon Huh, MD.,have documented all relevant documentation on the behalf of Lelon Huh, MD,as directed by  Lelon Huh, MD while in the presence of Lelon Huh, MD.    Established patient visit   Patient: Isabel Myers   DOB: January 25, 1930   88 y.o. Female  MRN: [id number] Visit Date: 04/28/2022  Today's healthcare provider: Lelon Huh, MD   No chief complaint on file.  Subjective    HPI  Hypertension, follow-up  BP Readings from Last 3 Encounters:  04/28/22 (!) 133/58  01/26/22 130/80  10/27/21 (!) 164/57   Wt Readings from Last 3 Encounters:  04/28/22 106 lb (48.1 kg)  01/26/22 105 lb (47.6 kg)  10/27/21 108 lb (49 kg)     She was last seen for hypertension 3 months ago.  BP at that visit was as above. Management since that visit includes making no changes in medications.  Patient was asked to check blood pressure outside of office and bring record of readings to appointment.  She reports good compliance with treatment. She is not having side effects.  She is following a Regular diet. She is exercising. She does not smoke.  Use of agents associated with hypertension: none.   Outside blood pressures are not being checked. Symptoms: No chest pain No chest pressure  No palpitations No syncope  No dyspnea No orthopnea  No paroxysmal nocturnal dyspnea No lower extremity edema   Pertinent labs Lab Results  Component Value Date   CHOL 185 09/12/2019   HDL 81 09/12/2019   LDLCALC 93 09/12/2019   TRIG 55 09/12/2019   CHOLHDL 2.3 09/12/2019   Lab Results  Component Value Date   NA 132 (L) 10/27/2021   K 3.7 10/27/2021   CREATININE 0.80 10/27/2021   EGFR 70 10/27/2021   GLUCOSE 89 10/27/2021   TSH 1.340 09/17/2020     The ASCVD Risk score (Arnett DK, et al., 2019) failed to calculate for the following reasons:   The 2019 ASCVD risk score is only valid for ages 54 to  3  ---------------------------------------------------------------------------------------------------   Medications: Outpatient Medications Prior to Visit  Medication Sig   amLODipine (NORVASC) 2.5 MG tablet Take 1 tablet (2.5 mg total) by mouth daily.   Calcium Carbonate (CALCIUM 600 PO) Take 1 tablet by mouth daily.   levothyroxine (SYNTHROID) 75 MCG tablet TAKE 1 TABLET BY MOUTH ONCE DAILY. TAKE ON EMPTY STOMACH WITH A GLASS OF WATER AT LEAST 30-60 MINUTES BEFORE BREAKFAST   Multiple Vitamins-Minerals (PRESERVISION AREDS 2 PO) Take 2 tablets by mouth daily at 6 (six) AM.   triamterene-hydrochlorothiazide (MAXZIDE) 75-50 MG tablet TAKE 1 TABLET BY MOUTH DAILY   No facility-administered medications prior to visit.    Review of Systems  Constitutional:  Negative for appetite change, chills, fatigue and fever.  Respiratory:  Negative for chest tightness and shortness of breath.   Cardiovascular:  Negative for chest pain and palpitations.  Gastrointestinal:  Negative for abdominal pain, nausea and vomiting.  Neurological:  Negative for dizziness and weakness.        Objective    BP (!) 133/58 (BP Location: Left Arm, Patient Position: Sitting, Cuff Size: Normal)   Pulse 69   Temp 97.7 F (36.5 C) (Oral)   Wt 106 lb (48.1 kg)   SpO2 95%   BMI 20.03 kg/m  Vitals:   04/28/22 0950 04/28/22 0951  BP: (!) 156/64 (!) 133/58  Pulse: 69   Temp: 97.7 F (  36.5 C)   TempSrc: Oral   SpO2: 95%   Weight: 106 lb (48.1 kg)     Physical Exam   General appearance: Well developed, well nourished female, cooperative and in no acute distress Head: Normocephalic, without obvious abnormality, atraumatic Respiratory: Respirations even and unlabored, normal respiratory rate Extremities: All extremities are intact.  Skin: She has a few skin tears on right lower arm from near fall this morning. Lesions have some dried blood, are laying in place. Appear clean.    Assessment & Plan     1.  Essential hypertension Well controlled.  Continue current medications.    2. Hyponatremia Secondary to diuretics - Renal function panel  3. Hypokalemia  - Renal function panel  4. Hypothyroidism, unspecified type  - TSH - T4, free  5. Skin tear of right elbow without complication, initial encounter Injuries appear clean, well approximated, and not actively bleeding. All covered with adhesive bandages.       The entirety of the information documented in the History of Present Illness, Review of Systems and Physical Exam were personally obtained by me. Portions of this information were initially documented by the CMA and reviewed by me for thoroughness and accuracy.     Lelon Huh, MD  [facility] [phone] (phone) [phone] (fax)  [place]

## 2022-04-28 ENCOUNTER — Ambulatory Visit (INDEPENDENT_AMBULATORY_CARE_PROVIDER_SITE_OTHER): Payer: PPO | Admitting: Family Medicine

## 2022-04-28 VITALS — BP 133/58 | HR 69 | Temp 97.7°F | Wt 106.0 lb

## 2022-04-28 DIAGNOSIS — E039 Hypothyroidism, unspecified: Secondary | ICD-10-CM | POA: Diagnosis not present

## 2022-04-28 DIAGNOSIS — S51011A Laceration without foreign body of right elbow, initial encounter: Secondary | ICD-10-CM

## 2022-04-28 DIAGNOSIS — E876 Hypokalemia: Secondary | ICD-10-CM

## 2022-04-28 DIAGNOSIS — I10 Essential (primary) hypertension: Secondary | ICD-10-CM | POA: Diagnosis not present

## 2022-04-28 DIAGNOSIS — E871 Hypo-osmolality and hyponatremia: Secondary | ICD-10-CM

## 2022-04-29 LAB — RENAL FUNCTION PANEL
Albumin: 4.7 g/dL — ABNORMAL HIGH (ref 3.6–4.6)
BUN/Creatinine Ratio: 21 (ref 12–28)
BUN: 16 mg/dL (ref 10–36)
CO2: 24 mmol/L (ref 20–29)
Calcium: 9.9 mg/dL (ref 8.7–10.3)
Chloride: 92 mmol/L — ABNORMAL LOW (ref 96–106)
Creatinine, Ser: 0.77 mg/dL (ref 0.57–1.00)
Glucose: 87 mg/dL (ref 70–99)
Phosphorus: 3.7 mg/dL (ref 3.0–4.3)
Potassium: 3.9 mmol/L (ref 3.5–5.2)
Sodium: 134 mmol/L (ref 134–144)
eGFR: 73 mL/min/{1.73_m2} (ref >59)

## 2022-04-29 LAB — TSH: TSH: 2.14 u[IU]/mL (ref 0.450–4.500)

## 2022-04-29 LAB — T4, FREE: Free T4: 1.8 ng/dL — ABNORMAL HIGH (ref 0.82–1.77)

## 2022-05-14 ENCOUNTER — Other Ambulatory Visit: Payer: Self-pay | Admitting: Family Medicine

## 2022-05-14 DIAGNOSIS — I10 Essential (primary) hypertension: Secondary | ICD-10-CM

## 2022-05-14 NOTE — Telephone Encounter (Signed)
Requested Prescriptions  Pending Prescriptions Disp Refills   amLODipine (NORVASC) 2.5 MG tablet [Pharmacy Med Name: AMLODIPINE BESYLATE 2.5 MG TAB] 90 tablet 4    Sig: TAKE 1 TABLET BY MOUTH DAILY     Cardiovascular: Calcium Channel Blockers 2 Passed - 05/14/2022  9:24 AM      Passed - Last BP in normal range    BP Readings from Last 1 Encounters:  04/28/22 (!) 133/58         Passed - Last Heart Rate in normal range    Pulse Readings from Last 1 Encounters:  04/28/22 69         Passed - Valid encounter within last 6 months    Recent Outpatient Visits           2 weeks ago Essential hypertension   [facility] Birdie Sons, MD   3 months ago Essential (primary) hypertension   [employer], [place], PA-C   6 months ago Hyponatremia   [facility] Birdie Sons, MD   1 year ago Essential (primary) hypertension   [facility], Kirstie Peri, MD   1 year ago Essential (primary) hypertension   [facility], Kirstie Peri, MD       Future Appointments             In 5 months Fisher, Kirstie Peri, MD [facility], [place]

## 2022-05-15 ENCOUNTER — Other Ambulatory Visit: Payer: Self-pay | Admitting: Family Medicine

## 2022-05-15 DIAGNOSIS — I10 Essential (primary) hypertension: Secondary | ICD-10-CM

## 2022-05-15 NOTE — Telephone Encounter (Signed)
Requested Prescriptions  Pending Prescriptions Disp Refills   amLODipine (NORVASC) 2.5 MG tablet [Pharmacy Med Name: AMLODIPINE BESYLATE 2.5 MG TAB] 90 tablet 1    Sig: TAKE 1 TABLET BY MOUTH DAILY     Cardiovascular: Calcium Channel Blockers 2 Passed - 05/15/2022 11:22 AM      Passed - Last BP in normal range    BP Readings from Last 1 Encounters:  04/28/22 (!) 133/58         Passed - Last Heart Rate in normal range    Pulse Readings from Last 1 Encounters:  04/28/22 69         Passed - Valid encounter within last 6 months    Recent Outpatient Visits           2 weeks ago Essential hypertension   [facility] Birdie Sons, MD   3 months ago Essential (primary) hypertension   [employer], [place], PA-C   6 months ago Hyponatremia   [facility] Birdie Sons, MD   1 year ago Essential (primary) hypertension   [facility], Kirstie Peri, MD   1 year ago Essential (primary) hypertension   [facility], Kirstie Peri, MD       Future Appointments             In 5 months Fisher, Kirstie Peri, MD [facility], [place]

## 2022-06-12 ENCOUNTER — Other Ambulatory Visit: Payer: Self-pay | Admitting: Family Medicine

## 2022-06-18 DIAGNOSIS — I7091 Generalized atherosclerosis: Secondary | ICD-10-CM | POA: Diagnosis not present

## 2022-06-30 DIAGNOSIS — L853 Xerosis cutis: Secondary | ICD-10-CM | POA: Diagnosis not present

## 2022-06-30 DIAGNOSIS — L814 Other melanin hyperpigmentation: Secondary | ICD-10-CM | POA: Diagnosis not present

## 2022-06-30 DIAGNOSIS — L309 Dermatitis, unspecified: Secondary | ICD-10-CM | POA: Diagnosis not present

## 2022-07-21 ENCOUNTER — Ambulatory Visit (INDEPENDENT_AMBULATORY_CARE_PROVIDER_SITE_OTHER): Payer: PPO

## 2022-07-21 VITALS — Wt 106.0 lb

## 2022-07-21 DIAGNOSIS — Z0000 Encounter for general adult medical examination without abnormal findings: Secondary | ICD-10-CM

## 2022-07-21 NOTE — Patient Instructions (Signed)
[name] , Thank you for taking time to come for your Medicare Wellness Visit. I appreciate your ongoing commitment to your health goals. Please review the following plan we discussed and let me know if I can assist you in the future.   Screening recommendations/referrals: Colonoscopy: aged out Mammogram: aged out Bone Density: aged out Recommended yearly ophthalmology/optometry visit for glaucoma screening and checkup Recommended yearly dental visit for hygiene and checkup  Vaccinations: Influenza vaccine: n/d Pneumococcal vaccine: 04/06/14 Tdap vaccine: 11/30/11, due if have injury Shingles vaccine: Zostavax 03/05/06    Shingrix 06/29/19   Covid-19:07/25/19, 08/23/19, 05/23/20, 11/22/20  Advanced directives: yes  Conditions/risks identified: none  Next appointment: Follow up in one year for your annual wellness visit 07/26/23 @ 3:30 pm by phone   Preventive Care 87 Years and Older, Female Preventive care refers to lifestyle choices and visits with your health care provider that can promote health and wellness. What does preventive care include? A yearly physical exam. This is also called an annual well check. Dental exams once or twice a year. Routine eye exams. Ask your health care provider how often you should have your eyes checked. Personal lifestyle choices, including: Daily care of your teeth and gums. Regular physical activity. Eating a healthy diet. Avoiding tobacco and drug use. Limiting alcohol use. Practicing safe sex. Taking low-dose aspirin every day. Taking vitamin and mineral supplements as recommended by your health care provider. What happens during an annual well check? The services and screenings done by your health care provider during your annual well check will depend on your age, overall health, lifestyle risk factors, and family history of disease. Counseling  Your health care provider may ask you questions about your: Alcohol use. Tobacco use. Drug  use. Emotional well-being. Home and relationship well-being. Sexual activity. Eating habits. History of falls. Memory and ability to understand (cognition). Work and work Statistician. Reproductive health. Screening  You may have the following tests or measurements: Height, weight, and BMI. Blood pressure. Lipid and cholesterol levels. These may be checked every 5 years, or more frequently if you are over 87 years old. Skin check. Lung cancer screening. You may have this screening every year starting at age 87 if you have a 30-pack-year history of smoking and currently smoke or have quit within the past 15 years. Fecal occult blood test (FOBT) of the stool. You may have this test every year starting at age 87. Flexible sigmoidoscopy or colonoscopy. You may have a sigmoidoscopy every 5 years or a colonoscopy every 10 years starting at age 87. Hepatitis C blood test. Hepatitis B blood test. Sexually transmitted disease (STD) testing. Diabetes screening. This is done by checking your blood sugar (glucose) after you have not eaten for a while (fasting). You may have this done every 1-3 years. Bone density scan. This is done to screen for osteoporosis. You may have this done starting at age 87. Mammogram. This may be done every 1-2 years. Talk to your health care provider about how often you should have regular mammograms. Talk with your health care provider about your test results, treatment options, and if necessary, the need for more tests. Vaccines  Your health care provider may recommend certain vaccines, such as: Influenza vaccine. This is recommended every year. Tetanus, diphtheria, and acellular pertussis (Tdap, Td) vaccine. You may need a Td booster every 10 years. Zoster vaccine. You may need this after age 87. Pneumococcal 13-valent conjugate (PCV13) vaccine. One dose is recommended after age 87. Pneumococcal polysaccharide (  PPSV23) vaccine. One dose is recommended after age  87. Talk to your health care provider about which screenings and vaccines you need and how often you need them. This information is not intended to replace advice given to you by your health care provider. Make sure you discuss any questions you have with your health care provider. Document Released: 07/26/2015 Document Revised: 03/18/2016 Document Reviewed: 04/30/2015 Elsevier Interactive Patient Education  2017 [place] Prevention in the Home Falls can cause injuries. They can happen to people of all ages. There are many things you can do to make your home safe and to help prevent falls. What can I do on the outside of my home? Regularly fix the edges of walkways and driveways and fix any cracks. Remove anything that might make you trip as you walk through a door, such as a raised step or threshold. Trim any bushes or trees on the path to your home. Use bright outdoor lighting. Clear any walking paths of anything that might make someone trip, such as rocks or tools. Regularly check to see if handrails are loose or broken. Make sure that both sides of any steps have handrails. Any raised decks and porches should have guardrails on the edges. Have any leaves, snow, or ice cleared regularly. Use sand or salt on walking paths during winter. Clean up any spills in your garage right away. This includes oil or grease spills. What can I do in the bathroom? Use night lights. Install grab bars by the toilet and in the tub and shower. Do not use towel bars as grab bars. Use non-skid mats or decals in the tub or shower. If you need to sit down in the shower, use a plastic, non-slip stool. Keep the floor dry. Clean up any water that spills on the floor as soon as it happens. Remove soap buildup in the tub or shower regularly. Attach bath mats securely with double-sided non-slip rug tape. Do not have throw rugs and other things on the floor that can make you trip. What can I do in the  bedroom? Use night lights. Make sure that you have a light by your bed that is easy to reach. Do not use any sheets or blankets that are too big for your bed. They should not hang down onto the floor. Have a firm chair that has side arms. You can use this for support while you get dressed. Do not have throw rugs and other things on the floor that can make you trip. What can I do in the kitchen? Clean up any spills right away. Avoid walking on wet floors. Keep items that you use a lot in easy-to-reach places. If you need to reach something above you, use a strong step stool that has a grab bar. Keep electrical cords out of the way. Do not use floor polish or wax that makes floors slippery. If you must use wax, use non-skid floor wax. Do not have throw rugs and other things on the floor that can make you trip. What can I do with my stairs? Do not leave any items on the stairs. Make sure that there are handrails on both sides of the stairs and use them. Fix handrails that are broken or loose. Make sure that handrails are as long as the stairways. Check any carpeting to make sure that it is firmly attached to the stairs. Fix any carpet that is loose or worn. Avoid having throw rugs at the top or  bottom of the stairs. If you do have throw rugs, attach them to the floor with carpet tape. Make sure that you have a light switch at the top of the stairs and the bottom of the stairs. If you do not have them, ask someone to add them for you. What else can I do to help prevent falls? Wear shoes that: Do not have high heels. Have rubber bottoms. Are comfortable and fit you well. Are closed at the toe. Do not wear sandals. If you use a stepladder: Make sure that it is fully opened. Do not climb a closed stepladder. Make sure that both sides of the stepladder are locked into place. Ask someone to hold it for you, if possible. Clearly mark and make sure that you can see: Any grab bars or  handrails. First and last steps. Where the edge of each step is. Use tools that help you move around (mobility aids) if they are needed. These include: Canes. Walkers. Scooters. Crutches. Turn on the lights when you go into a dark area. Replace any light bulbs as soon as they burn out. Set up your furniture so you have a clear path. Avoid moving your furniture around. If any of your floors are uneven, fix them. If there are any pets around you, be aware of where they are. Review your medicines with your doctor. Some medicines can make you feel dizzy. This can increase your chance of falling. Ask your doctor what other things that you can do to help prevent falls. This information is not intended to replace advice given to you by your health care provider. Make sure you discuss any questions you have with your health care provider. Document Released: 04/25/2009 Document Revised: 12/05/2015 Document Reviewed: 08/03/2014 Elsevier Interactive Patient Education  2017 [employer].

## 2022-07-21 NOTE — Progress Notes (Signed)
Virtual Visit via Telephone Note  I connected with  Isabel Myers on 07/21/22 at  2:45 PM EST by telephone and verified that I am speaking with the correct person using two identifiers.  Location: Patient: home Provider: [facility] Persons participating in the virtual visit: patient/Nurse Health Advisor   I discussed the limitations, risks, security and privacy concerns of performing an evaluation and management service by telephone and the availability of in person appointments. The patient expressed understanding and agreed to proceed.  Interactive audio and video telecommunications were attempted between this nurse and patient, however failed, due to patient having technical difficulties OR patient did not have access to video capability.  We continued and completed visit with audio only.  Some vital signs may be absent or patient reported.   Hal Hope, LPN  Subjective:   Isabel Myers is a 89 y.o. female who presents for Medicare Annual (Subsequent) preventive examination.  Review of Systems     Cardiac Risk Factors include: advanced age (>91men, >39 women);hypertension     Objective:    There were no vitals filed for this visit. There is no height or weight on file to calculate BMI.     07/21/2022    2:50 PM 10/03/2021    4:43 PM 07/03/2021    9:19 AM 06/30/2021   11:28 AM 06/25/2020    2:39 PM 06/14/2019    3:06 PM 05/22/2019   12:41 PM  Advanced Directives  Does Patient Have a Medical Advance Directive? Yes Yes Yes No Yes Yes Yes  Type of Estate agent of [place];Living will Out of facility DNR (pink MOST or yellow form) Healthcare Power of [place];Living will  Healthcare Power of [place];Living will Healthcare Power of [place];Living will Living will  Does patient want to make changes to medical advance directive? No - Patient declined  Yes (Inpatient - patient defers changing a medical advance directive and declines information at this  time)      Copy of Healthcare Power of Attorney in Chart? Yes - validated most recent copy scanned in chart (See row information)  Yes - validated most recent copy scanned in chart (See row information)  No - copy requested No - copy requested   Would patient like information on creating a medical advance directive?   No - Patient declined No - Patient declined   No - Patient declined    Current Medications (verified) Outpatient Encounter Medications as of 07/21/2022  Medication Sig   amLODipine (NORVASC) 2.5 MG tablet TAKE 1 TABLET BY MOUTH DAILY   Calcium Carbonate (CALCIUM 600 PO) Take 1 tablet by mouth daily.   levothyroxine (SYNTHROID) 75 MCG tablet TAKE 1 TABLET BY MOUTH ONCE DAILY. TAKE ON EMPTY STOMACH WITH A GLASS OF WATER AT LEAST 30-60 MINUTES BEFORE BREAKFAST   Multiple Vitamins-Minerals (PRESERVISION AREDS 2 PO) Take 2 tablets by mouth daily at 6 (six) AM.   triamterene-hydrochlorothiazide (MAXZIDE) 75-50 MG tablet TAKE 1 TABLET BY MOUTH DAILY   No facility-administered encounter medications on file as of 07/21/2022.    Allergies (verified) Amlodipine   History: Past Medical History:  Diagnosis Date   Hypertension    Thyroid disease    hypothyroid   Past Surgical History:  Procedure Laterality Date   ABDOMINAL HYSTERECTOMY  1987   with BSO   Bladder tack  2009   Dr. Chrissie Noa   BUNIONECTOMY     CATARACT EXTRACTION, BILATERAL     CT Cervical Spine  10/22/2011  negative   CT Scan of head  10/22/2011   without contrast, [facility], Normal   THYROIDECTOMY  1937   SUBTOTAL   TONSILLECTOMY AND ADENOIDECTOMY     Family History  Problem Relation Age of Onset   Memory loss Sister        short term memory loss   Social History   Socioeconomic History   Marital status: Widowed    Spouse name: Not on file   Number of children: 2   Years of education: Not on file   Highest education level: Master's degree (e.g., MA, MS, MEng, MEd, MSW, MBA)  Occupational History    Occupation: Retired  Tobacco Use   Smoking status: Former    Packs/day: 0.50    Years: 15.00    Total pack years: 7.50    Types: Cigarettes   Smokeless tobacco: Never   Tobacco comments:    Quit in the 1970s  Vaping Use   Vaping Use: Never used  Substance and Sexual Activity   Alcohol use: Yes    Alcohol/week: 7.0 standard drinks of alcohol    Types: 7 Glasses of wine per week    Comment: 1 glass a day   Drug use: No   Sexual activity: Not on file  Other Topics Concern   Not on file  Social History Narrative   Not on file   Social Determinants of Health   Financial Resource Strain: Low Risk  (07/21/2022)   Overall Financial Resource Strain (CARDIA)    Difficulty of Paying Living Expenses: Not hard at all  Food Insecurity: No Food Insecurity (07/21/2022)   Hunger Vital Sign    Worried About Running Out of Food in the Last Year: Never true    Ran Out of Food in the Last Year: Never true  Transportation Needs: No Transportation Needs (07/21/2022)   PRAPARE - Administrator, Civil Service (Medical): No    Lack of Transportation (Non-Medical): No  Physical Activity: Insufficiently Active (07/21/2022)   Exercise Vital Sign    Days of Exercise per Week: 3 days    Minutes of Exercise per Session: 30 min  Stress: No Stress Concern Present (07/21/2022)   [employer] of Occupational Health - Occupational Stress Questionnaire    Feeling of Stress : Not at all  Social Connections: Moderately Isolated (07/21/2022)   Social Connection and Isolation Panel [NHANES]    Frequency of Communication with Friends and Family: Twice a week    Frequency of Social Gatherings with Friends and Family: Three times a week    Attends Religious Services: More than 4 times per year    Active Member of Clubs or Organizations: No    Attends Banker Meetings: Never    Marital Status: Widowed    Tobacco Counseling Counseling given: Not Answered Tobacco comments: Quit in the  1970s   Clinical Intake:  Pre-visit preparation completed: Yes  Pain : No/denies pain     Diabetes: No  How often do you need to have someone help you when you read instructions, pamphlets, or other written materials from your doctor or pharmacy?: 1 - Never  Diabetic?no  Interpreter Needed?: No  Information entered by :: Kennedy Bucker, LPN   Activities of Daily Living    07/21/2022    2:52 PM 04/28/2022    9:51 AM  In your present state of health, do you have any difficulty performing the following activities:  Hearing? 1 1  Vision? 0 1  Difficulty  concentrating or making decisions? 0 0  Walking or climbing stairs? 1 1  Dressing or bathing? 0 0  Doing errands, shopping? 0 0  Preparing Food and eating ? N   Using the Toilet? N   In the past six months, have you accidently leaked urine? N   Do you have problems with loss of bowel control? N   Managing your Medications? N   Managing your Finances? N   Housekeeping or managing your Housekeeping? N     Patient Care Team: [name], MD as PCP - General (Family Medicine) Dingeldein, Viviann Spare, MD (Ophthalmology) Fransico Michael, MD as Referring Physician (Audiology)  Indicate any recent Medical Services you may have received from other than [facility] providers in the past year (date may be approximate).     Assessment:   This is a routine wellness examination for [place].  Hearing/Vision screen Hearing Screening - Comments:: Wears aids Vision Screening - Comments:: Wears glasses- [facility]  Dietary issues and exercise activities discussed: Current Exercise Habits: Home exercise routine, Type of exercise: walking;stretching, Time (Minutes): 30, Frequency (Times/Week): 3, Weekly Exercise (Minutes/Week): 90, Intensity: Mild   Goals Addressed             This Visit's Progress    DIET - INCREASE WATER INTAKE         Depression Screen    07/21/2022    2:48 PM 04/28/2022    9:51 AM 07/03/2021    9:18 AM  09/17/2020    9:25 AM 06/25/2020    2:31 PM 06/14/2019    3:15 PM 06/02/2018    2:26 PM  PHQ 2/9 Scores  PHQ - 2 Score 0 0 0 0 0 0 0  PHQ- 9 Score 0 0  0   0    Fall Risk    07/21/2022    2:51 PM 04/28/2022    9:51 AM 07/03/2021    9:20 AM 09/17/2020    9:24 AM 06/25/2020    2:39 PM  Fall Risk   Falls in the past year? 1 1 1  0 0  Number falls in past yr: 0 0 0 0 0  Injury with Fall? 0 0 1 0 0  Risk for fall due to : History of fall(s)  History of fall(s)    Follow up Falls evaluation completed;Falls prevention discussed  Falls prevention discussed      FALL RISK PREVENTION PERTAINING TO THE HOME:  Any stairs in or around the home? Yes  If so, are there any without handrails? No  Home free of loose throw rugs in walkways, pet beds, electrical cords, etc? Yes  Adequate lighting in your home to reduce risk of falls? Yes   ASSISTIVE DEVICES UTILIZED TO PREVENT FALLS:  Life alert? Yes  Use of a cane, walker or w/c? Yes  Grab bars in the bathroom? Yes  Shower chair or bench in shower? Yes  Elevated toilet seat or a handicapped toilet? Yes    Cognitive Function:        07/21/2022    2:53 PM 06/02/2018    2:27 PM 05/31/2017   11:25 AM  6CIT Screen  What Year? 0 points 0 points 0 points  What month? 0 points 0 points   What time? 0 points 0 points 0 points  Count back from 20 0 points 0 points 0 points  Months in reverse 0 points 0 points 0 points  Repeat phrase 0 points 0 points 0 points  Total Score  0 points 0 points     Immunizations Immunization History  Administered Date(s) Administered   DTaP 07/28/1993   Influenza, High Dose Seasonal PF 04/12/2020   Influenza-Unspecified 04/12/2016, 04/23/2018, 04/22/2022   Moderna Sars-Covid-2 Vaccination 07/25/2019, 08/23/2019, 05/23/2020, 11/22/2020   Pneumococcal Conjugate-13 04/06/2014   Pneumococcal Polysaccharide-23 07/28/2006   Td 08/09/2007   Tdap 11/30/2011   Zoster Recombinat (Shingrix) 06/29/2019   Zoster, Live  03/05/2006    TDAP status: Due, Education has been provided regarding the importance of this vaccine. Advised may receive this vaccine at local pharmacy or Health Dept. Aware to provide a copy of the vaccination record if obtained from local pharmacy or Health Dept. Verbalized acceptance and understanding.  Flu Vaccine status: Up to date  Pneumococcal vaccine status: Up to date  Covid-19 vaccine status: Completed vaccines  Qualifies for Shingles Vaccine? Yes   Zostavax completed Yes   Shingrix Completed?: No.    Education has been provided regarding the importance of this vaccine. Patient has been advised to call insurance company to determine out of pocket expense if they have not yet received this vaccine. Advised may also receive vaccine at local pharmacy or Health Dept. Verbalized acceptance and understanding.  Screening Tests Health Maintenance  Topic Date Due   Zoster Vaccines- Shingrix (2 of 2) 08/24/2019   DTaP/Tdap/Td (4 - Td or Tdap) 11/29/2021   COVID-19 Vaccine (5 - 2023-24 season) 03/13/2022   DEXA SCAN  09/15/2022   Medicare Annual Wellness (AWV)  07/22/2023   Pneumonia Vaccine 54+ Years old  Completed   INFLUENZA VACCINE  Completed   HPV VACCINES  Aged Out    Health Maintenance  Health Maintenance Due  Topic Date Due   Zoster Vaccines- Shingrix (2 of 2) 08/24/2019   DTaP/Tdap/Td (4 - Td or Tdap) 11/29/2021   COVID-19 Vaccine (5 - 2023-24 season) 03/13/2022    Colorectal cancer screening: No longer required.   Mammogram status: No longer required due to age.  Lung Cancer Screening: (Low Dose CT Chest recommended if Age 28-80 years, 30 pack-year currently smoking OR have quit w/in 15years.) does not qualify.   Additional Screening:  Hepatitis C Screening: does not qualify; Completed no  Vision Screening: Recommended annual ophthalmology exams for early detection of glaucoma and other disorders of the eye. Is the patient up to date with their annual eye  exam?  Yes  Who is the provider or what is the name of the office in which the patient attends annual eye exams? [place] If pt is not established with a provider, would they like to be referred to a provider to establish care? No .   Dental Screening: Recommended annual dental exams for proper oral hygiene  Community Resource Referral / Chronic Care Management: CRR required this visit?  No   CCM required this visit?  No      Plan:     I have personally reviewed and noted the following in the patient's chart:   Medical and social history Use of alcohol, tobacco or illicit drugs  Current medications and supplements including opioid prescriptions. Patient is not currently taking opioid prescriptions. Functional ability and status Nutritional status Physical activity Advanced directives List of other physicians Hospitalizations, surgeries, and ER visits in previous 12 months Vitals Screenings to include cognitive, depression, and falls Referrals and appointments  In addition, I have reviewed and discussed with patient certain preventive protocols, quality metrics, and best practice recommendations. A written personalized care plan for preventive services as well as  general preventive health recommendations were provided to patient.     Hal Hope, LPN   4/0/7680   Nurse Notes: none

## 2022-08-05 DIAGNOSIS — I10 Essential (primary) hypertension: Secondary | ICD-10-CM | POA: Diagnosis not present

## 2022-08-05 DIAGNOSIS — Z9849 Cataract extraction status, unspecified eye: Secondary | ICD-10-CM | POA: Diagnosis not present

## 2022-08-05 DIAGNOSIS — G8929 Other chronic pain: Secondary | ICD-10-CM | POA: Diagnosis not present

## 2022-08-05 DIAGNOSIS — E039 Hypothyroidism, unspecified: Secondary | ICD-10-CM | POA: Diagnosis not present

## 2022-08-05 DIAGNOSIS — Z87891 Personal history of nicotine dependence: Secondary | ICD-10-CM | POA: Diagnosis not present

## 2022-08-05 DIAGNOSIS — M1990 Unspecified osteoarthritis, unspecified site: Secondary | ICD-10-CM | POA: Diagnosis not present

## 2022-08-05 DIAGNOSIS — I700 Atherosclerosis of aorta: Secondary | ICD-10-CM | POA: Diagnosis not present

## 2022-08-12 ENCOUNTER — Other Ambulatory Visit: Payer: Self-pay | Admitting: Family Medicine

## 2022-08-12 DIAGNOSIS — I10 Essential (primary) hypertension: Secondary | ICD-10-CM

## 2022-08-12 NOTE — Telephone Encounter (Signed)
Requested medication (s) are due for refill today:   No  Requested medication (s) are on the active medication list:   Yes  Future visit scheduled:   Yes   Last ordered: 05/15/2022 #90, 1 refill  Returned because this rx is expired.   A new one is being requested   Requested Prescriptions  Pending Prescriptions Disp Refills   amLODipine (NORVASC) 2.5 MG tablet [Pharmacy Med Name: AMLODIPINE BESYLATE 2.5 MG TAB] 90 tablet 1    Sig: TAKE 1 TABLET BY MOUTH DAILY     Cardiovascular: Calcium Channel Blockers 2 Passed - 08/12/2022 10:12 AM      Passed - Last BP in normal range    BP Readings from Last 1 Encounters:  04/28/22 (!) 133/58         Passed - Last Heart Rate in normal range    Pulse Readings from Last 1 Encounters:  04/28/22 69         Passed - Valid encounter within last 6 months    Recent Outpatient Visits           3 months ago Essential hypertension   [place], Donald E, MD   6 months ago Essential (primary) hypertension   [place] [facility], [place], PA-C   9 months ago Hyponatremia   [facility] Birdie Sons, MD   1 year ago Essential (primary) hypertension   [place], Donald E, MD   1 year ago Essential (primary) hypertension   [place], Donald E, MD       Future Appointments             In 2 months Fisher, Kirstie Peri, MD [facility], [facility]

## 2022-08-26 DIAGNOSIS — B351 Tinea unguium: Secondary | ICD-10-CM | POA: Diagnosis not present

## 2022-08-26 DIAGNOSIS — I7091 Generalized atherosclerosis: Secondary | ICD-10-CM | POA: Diagnosis not present

## 2022-10-12 ENCOUNTER — Other Ambulatory Visit: Payer: Self-pay | Admitting: Family Medicine

## 2022-11-02 NOTE — Progress Notes (Unsigned)
   Vivien Rota DeSanto,acting as a scribe for Mila Merry, MD.,have documented all relevant documentation on the behalf of Mila Merry, MD,as directed by  Mila Merry, MD while in the presence of Mila Merry, MD.     Established patient visit   Patient: Isabel Myers   DOB: [birth year]   89 y.o. Female  MRN: [id number] Visit Date: 11/03/2022  Today's healthcare provider: Mila Merry, MD   No chief complaint on file.  Subjective    HPI  Hypertension, follow-up  BP Readings from Last 3 Encounters:  04/28/22 (!) 133/58  01/26/22 130/80  10/27/21 (!) 164/57   Wt Readings from Last 3 Encounters:  07/21/22 106 lb (48.1 kg)  04/28/22 106 lb (48.1 kg)  01/26/22 105 lb (47.6 kg)     She was last seen for hypertension 6 months ago.  BP at that visit was as above. Management since that visit includes none.  She reports {excellent/good/fair/poor:19665} compliance with treatment. She {is/is not:9024} having side effects. {document side effects if present:1} She is following a {diet:21022986} diet. She {is/is not:9024} exercising. She {does/does not:200015} smoke.  Use of agents associated with hypertension: {bp agents assoc with hypertension:511::"none"}.   Outside blood pressures are {***enter patient reported home BP readings, or 'not being checked':1}. Symptoms: {Yes/No:20286} chest pain {Yes/No:20286} chest pressure  {Yes/No:20286} palpitations {Yes/No:20286} syncope  {Yes/No:20286} dyspnea {Yes/No:20286} orthopnea  {Yes/No:20286} paroxysmal nocturnal dyspnea {Yes/No:20286} lower extremity edema   Pertinent labs Lab Results  Component Value Date   CHOL 185 09/12/2019   HDL 81 09/12/2019   LDLCALC 93 09/12/2019   TRIG 55 09/12/2019   CHOLHDL 2.3 09/12/2019   Lab Results  Component Value Date   NA 134 04/28/2022   K 3.9 04/28/2022   CREATININE 0.77 04/28/2022   EGFR 73 04/28/2022   GLUCOSE 87 04/28/2022   TSH 2.140 04/28/2022     The ASCVD Risk score  (Arnett DK, et al., 2019) failed to calculate for the following reasons:   The 2019 ASCVD risk score is only valid for ages 69 to 73  ---------------------------------------------------------------------------------------------------   Medications: Outpatient Medications Prior to Visit  Medication Sig   amLODipine (NORVASC) 2.5 MG tablet TAKE 1 TABLET BY MOUTH DAILY   Calcium Carbonate (CALCIUM 600 PO) Take 1 tablet by mouth daily.   levothyroxine (SYNTHROID) 75 MCG tablet TAKE 1 TABLET BY MOUTH ONCE DAILY. TAKE ON EMPTY STOMACH WITH A GLASS OF WATER AT LEAST 30-60 MINUTES BEFORE BREAKFAST   Multiple Vitamins-Minerals (PRESERVISION AREDS 2 PO) Take 2 tablets by mouth daily at 6 (six) AM.   triamterene-hydrochlorothiazide (MAXZIDE) 75-50 MG tablet TAKE 1 TABLET BY MOUTH DAILY   No facility-administered medications prior to visit.    Review of Systems  {Labs  Heme  Chem  Endocrine  Serology  Results Review (optional):23779}   Objective    There were no vitals taken for this visit. {Show previous vital signs (optional):23777}  Physical Exam  ***  No results found for any visits on 11/03/22.  Assessment & Plan     ***  No follow-ups on file.      {provider attestation***:1}   Mila Merry, MD  [facility] [phone] (phone) [phone] (fax)  [facility]

## 2022-11-03 ENCOUNTER — Encounter: Payer: Self-pay | Admitting: Family Medicine

## 2022-11-03 ENCOUNTER — Ambulatory Visit (INDEPENDENT_AMBULATORY_CARE_PROVIDER_SITE_OTHER): Payer: PPO | Admitting: Family Medicine

## 2022-11-03 VITALS — BP 136/75 | HR 68 | Temp 97.6°F | Resp 12 | Wt 107.7 lb

## 2022-11-03 DIAGNOSIS — E039 Hypothyroidism, unspecified: Secondary | ICD-10-CM | POA: Diagnosis not present

## 2022-11-03 DIAGNOSIS — I10 Essential (primary) hypertension: Secondary | ICD-10-CM

## 2022-11-03 DIAGNOSIS — E876 Hypokalemia: Secondary | ICD-10-CM | POA: Diagnosis not present

## 2022-11-03 NOTE — Patient Instructions (Signed)
.   Please review the attached list of medications and notify my office if there are any errors.   . Please bring all of your medications to every appointment so we can make sure that our medication list is the same as yours.   

## 2022-11-04 LAB — RENAL FUNCTION PANEL
Albumin: 4.3 g/dL (ref 3.6–4.6)
BUN/Creatinine Ratio: 23 (ref 12–28)
BUN: 16 mg/dL (ref 10–36)
CO2: 25 mmol/L (ref 20–29)
Calcium: 10 mg/dL (ref 8.7–10.3)
Chloride: 94 mmol/L — ABNORMAL LOW (ref 96–106)
Creatinine, Ser: 0.69 mg/dL (ref 0.57–1.00)
Glucose: 88 mg/dL (ref 70–99)
Phosphorus: 3.4 mg/dL (ref 3.0–4.3)
Potassium: 3.9 mmol/L (ref 3.5–5.2)
Sodium: 134 mmol/L (ref 134–144)
eGFR: 82 mL/min/{1.73_m2} (ref >59)

## 2022-11-04 LAB — T4, FREE: Free T4: 1.69 ng/dL (ref 0.82–1.77)

## 2022-11-04 LAB — TSH: TSH: 1.66 u[IU]/mL (ref 0.450–4.500)

## 2022-11-04 LAB — MAGNESIUM: Magnesium: 2 mg/dL (ref 1.6–2.3)

## 2023-02-06 ENCOUNTER — Other Ambulatory Visit: Payer: Self-pay | Admitting: Family Medicine

## 2023-02-06 DIAGNOSIS — I10 Essential (primary) hypertension: Secondary | ICD-10-CM

## 2023-02-08 NOTE — Telephone Encounter (Signed)
Requested medication (s) are due for refill today:   Yes  Requested medication (s) are on the active medication list:   Yes  Future visit scheduled:   Yes 10/23 with Dr. Sherrie Mustache   Last ordered: 08/13/2022 #90, 1 refill  Returned because amlodipine is on her allergy list because it causes too much swelling     Requested Prescriptions  Pending Prescriptions Disp Refills   amLODipine (NORVASC) 2.5 MG tablet [Pharmacy Med Name: AMLODIPINE BESYLATE 2.5 MG TAB] 90 tablet 1    Sig: TAKE 1 TABLET BY MOUTH DAILY     Cardiovascular: Calcium Channel Blockers 2 Passed - 02/06/2023  9:13 AM      Passed - Last BP in normal range    BP Readings from Last 1 Encounters:  11/03/22 136/75         Passed - Last Heart Rate in normal range    Pulse Readings from Last 1 Encounters:  11/03/22 68         Passed - Valid encounter within last 6 months    Recent Outpatient Visits           3 months ago Essential hypertension   [place] [facility] [name], MD   9 months ago Essential hypertension   [place] [facility] [name], MD   1 year ago Essential (primary) hypertension   [facility] [place], [place], PA-C   1 year ago Hyponatremia   [facility] [name], MD   1 year ago Essential (primary) hypertension   [place] [facility] [name], MD       Future Appointments             In 2 months Fisher, Demetrios Isaacs, MD [facility]

## 2023-02-26 DIAGNOSIS — D692 Other nonthrombocytopenic purpura: Secondary | ICD-10-CM | POA: Diagnosis not present

## 2023-02-26 DIAGNOSIS — L821 Other seborrheic keratosis: Secondary | ICD-10-CM | POA: Diagnosis not present

## 2023-02-26 DIAGNOSIS — D1801 Hemangioma of skin and subcutaneous tissue: Secondary | ICD-10-CM | POA: Diagnosis not present

## 2023-02-26 DIAGNOSIS — L814 Other melanin hyperpigmentation: Secondary | ICD-10-CM | POA: Diagnosis not present

## 2023-02-26 DIAGNOSIS — L853 Xerosis cutis: Secondary | ICD-10-CM | POA: Diagnosis not present

## 2023-04-02 DIAGNOSIS — D692 Other nonthrombocytopenic purpura: Secondary | ICD-10-CM | POA: Diagnosis not present

## 2023-04-02 DIAGNOSIS — L853 Xerosis cutis: Secondary | ICD-10-CM | POA: Diagnosis not present

## 2023-05-01 ENCOUNTER — Other Ambulatory Visit: Payer: Self-pay

## 2023-05-01 ENCOUNTER — Emergency Department
Admission: EM | Admit: 2023-05-01 | Discharge: 2023-05-01 | Disposition: A | Discharge status: Home / Self Care | Payer: PPO | Attending: Emergency Medicine | Admitting: Emergency Medicine

## 2023-05-01 ENCOUNTER — Encounter: Payer: Self-pay | Admitting: Emergency Medicine

## 2023-05-01 DIAGNOSIS — W19XXXA Unspecified fall, initial encounter: Secondary | ICD-10-CM | POA: Diagnosis not present

## 2023-05-01 DIAGNOSIS — S3992XA Unspecified injury of lower back, initial encounter: Secondary | ICD-10-CM | POA: Diagnosis not present

## 2023-05-01 DIAGNOSIS — I10 Essential (primary) hypertension: Secondary | ICD-10-CM | POA: Insufficient documentation

## 2023-05-01 DIAGNOSIS — R404 Transient alteration of awareness: Secondary | ICD-10-CM | POA: Diagnosis not present

## 2023-05-01 DIAGNOSIS — W1830XA Fall on same level, unspecified, initial encounter: Secondary | ICD-10-CM | POA: Insufficient documentation

## 2023-05-01 DIAGNOSIS — R6889 Other general symptoms and signs: Secondary | ICD-10-CM | POA: Diagnosis not present

## 2023-05-01 DIAGNOSIS — M545 Low back pain, unspecified: Secondary | ICD-10-CM | POA: Diagnosis present

## 2023-05-01 DIAGNOSIS — Z743 Need for continuous supervision: Secondary | ICD-10-CM | POA: Diagnosis not present

## 2023-05-01 NOTE — ED Provider Notes (Signed)
[facility] Provider Note    Event Date/Time   First MD Initiated Contact with Patient 05/01/23 1140     (approximate)   History   Fall   HPI  Isabel Myers is a 89 year old female with history of hypertension presenting to the emergency department for evaluation after a fall.  Patient was trying to open a door at [place] that was stuck.  She pulled forcefully on it causing her to fall backwards.  Denies hitting her head.  No LOC.  Initially reported some soreness over her buttocks, but reports that this is now improved at the time of my initial evaluation.  Denies preceding chest pain, shortness of breath, lightheadedness.  She was assisted by bystanders and able to be  stood up and walked to a chair, but they did recommend presentation to the ER to ensure that she did not have any significant injuries.      Physical Exam   Triage Vital Signs: ED Triage Vitals  Encounter Vitals Group     BP 05/01/23 1114 (!) 170/91     Systolic BP Percentile --      Diastolic BP Percentile --      Pulse Rate 05/01/23 1114 (!) 58     Resp 05/01/23 1114 20     Temp 05/01/23 1114 98.5 F (36.9 C)     Temp Source 05/01/23 1114 Oral     SpO2 05/01/23 1114 97 %     Weight 05/01/23 1112 108 lb (49 kg)     Height 05/01/23 1112 5\' 1"  (1.549 m)     Head Circumference --      Peak Flow --      Pain Score 05/01/23 1111 0     Pain Loc --      Pain Education --      Exclude from Growth Chart --     Most recent vital signs: Vitals:   05/01/23 1114  BP: (!) 170/91  Pulse: (!) 58  Resp: 20  Temp: 98.5 F (36.9 C)  SpO2: 97%   Nursing notes and vital signs reviewed.  General: Adult female, laying in bed, awake, reactive Head: Atraumatic Neck: No midline pain Chest: Symmetric chest rise, no tenderness to palpation.  Cardiac: Regular rhythm and rate.  Respiratory: Lungs clear to auscultation Abdomen: Soft, nondistended. No tenderness to palpation.  Pelvis:  Stable in AP and lateral compression. No tenderness to palpation. Back: No tenderness to palpation of the midline spine or paraspinous regions.  No appreciable tenderness over the sacral region. MSK: No deformity to bilateral upper and lower extremity. Full range of motion to bilateral upper lower extremity with no pain. Neuro: Alert, oriented. GCS 15. 5 out of 5 strength in bilateral upper and lower extremities. Normal sensation to light touch in bilateral upper and lower extremity. Skin: No evidence of burns or lacerations.   ED Results / Procedures / Treatments   Labs (all labs ordered are listed, but only abnormal results are displayed) Labs Reviewed - No data to display   EKG EKG independently reviewed interpreted by myself (ER attending) demonstrates:    RADIOLOGY Imaging independently reviewed and interpreted by myself demonstrates:    PROCEDURES:  Critical Care performed: No  Procedures   MEDICATIONS ORDERED IN ED: Medications - No data to display   IMPRESSION / MDM / ASSESSMENT AND PLAN / ED COURSE  I reviewed the triage vital signs and the nursing notes.  Differential diagnosis includes, but is not limited  to, muscle strain, bruised bone, low suspicion for fracture given the absence of identifiable tenderness on exam, no head or neck trauma, no evidence of thoracoabdominal trauma  Patient's presentation is most consistent with acute, uncomplicated illness.  89 year old female presenting after a fall.  Initial soreness over her buttocks, but improved by the time of my initial evaluation.  Patient usually walks with a cane or walker.  I had the patient ambulate from her bed to the bathroom and back.  She reports her walking feels completely back to normal.  She is comfortable with discharge home.  At this point, I do not think there is indication for imaging.  Patient they are comfortable discharge home.  Strict return precautions were provided.  Patient was discharged  stable condition.     FINAL CLINICAL IMPRESSION(S) / ED DIAGNOSES   Final diagnoses:  Buttock trauma, initial encounter  Fall, initial encounter     Rx / DC Orders   ED Discharge Orders     None        Note:  This document was prepared using Dragon voice recognition software and may include unintentional dictation errors.   Trinna Post, MD 05/01/23 [phone]

## 2023-05-01 NOTE — ED Triage Notes (Signed)
Pt via [facility] from [employer]. Pt was trying to open the door to [place] and the door was stuck and fell backwards. Denies head injury, pt did land on her bottom and states she is sore. Denies LOC. Denies disorientation. Pt is A&Ox4 and NAD.

## 2023-05-01 NOTE — Discharge Instructions (Signed)
Your recent in the ER today for evaluation after your fall.  Your here was fortunately reassuring.  Continue to use your cane and walker as directed.  Follow with your primary care doctor as needed.  Return to the ER for new or worsening symptoms.

## 2023-05-05 ENCOUNTER — Ambulatory Visit (INDEPENDENT_AMBULATORY_CARE_PROVIDER_SITE_OTHER): Payer: PPO | Admitting: Family Medicine

## 2023-05-05 ENCOUNTER — Encounter: Payer: Self-pay | Admitting: Family Medicine

## 2023-05-05 VITALS — BP 138/58 | HR 73 | Wt 99.3 lb

## 2023-05-05 DIAGNOSIS — E039 Hypothyroidism, unspecified: Secondary | ICD-10-CM

## 2023-05-05 DIAGNOSIS — I10 Essential (primary) hypertension: Secondary | ICD-10-CM

## 2023-05-05 NOTE — Patient Instructions (Signed)
.   Please review the attached list of medications and notify my office if there are any errors.   . Please bring all of your medications to every appointment so we can make sure that our medication list is the same as yours.   

## 2023-05-05 NOTE — Progress Notes (Signed)
      Established patient visit   Patient: Isabel Myers   DOB: [birth year]   89 y.o. Female  MRN: [id number] Visit Date: 05/05/2023  Today's healthcare provider: Mila Merry, MD   Chief Complaint  Patient presents with   Medical Management of Chronic Issues    6 month follow-up   Subjective    HPI Here to follow up hypertension and hypothyroid. Feels well. Had fall last week, but no persistent injuries. No other complaints today. Lab Results  Component Value Date   NA 134 11/03/2022   K 3.9 11/03/2022   CREATININE 0.69 11/03/2022   EGFR 82 11/03/2022   GLUCOSE 88 11/03/2022   Lab Results  Component Value Date   TSH 1.660 11/03/2022     Medications: Outpatient Medications Prior to Visit  Medication Sig   amLODipine (NORVASC) 2.5 MG tablet TAKE 1 TABLET BY MOUTH DAILY   Calcium Carbonate (CALCIUM 600 PO) Take 1 tablet by mouth daily.   levothyroxine (SYNTHROID) 75 MCG tablet TAKE 1 TABLET BY MOUTH ONCE DAILY. TAKE ON EMPTY STOMACH WITH A GLASS OF WATER AT LEAST 30-60 MINUTES BEFORE BREAKFAST   Multiple Vitamins-Minerals (PRESERVISION AREDS 2 PO) Take 2 tablets by mouth daily at 6 (six) AM.   triamterene-hydrochlorothiazide (MAXZIDE) 75-50 MG tablet TAKE 1 TABLET BY MOUTH DAILY   No facility-administered medications prior to visit.    Review of Systems     Objective    BP (!) 138/58   Pulse 73   Wt 99 lb 4.8 oz (45 kg)   SpO2 98%   BMI 18.76 kg/m    Physical Exam   General: Appearance:    Thin female in no acute distress  Eyes:    PERRL, conjunctiva/corneas clear, EOM's intact       Lungs:     Clear to auscultation bilaterally, respirations unlabored  Heart:    Normal heart rate. Normal rhythm. No murmurs, rubs, or gallops.    MS:   All extremities are intact.    Neurologic:   Awake, alert, oriented x 3. No apparent focal neurological defect.         Assessment & Plan     1. Hypothyroidism, unspecified type  - TSH - T4, free  2. Essential  hypertension Well controlled.  Continue current medications.   - Renal function panel         Mila Merry, MD  [facility] [phone] (phone) [phone] (fax)  [facility]

## 2023-05-06 ENCOUNTER — Encounter: Payer: Self-pay | Admitting: Family Medicine

## 2023-05-06 LAB — RENAL FUNCTION PANEL
Albumin: 4.4 g/dL (ref 3.6–4.6)
BUN/Creatinine Ratio: 19 (ref 12–28)
BUN: 15 mg/dL (ref 10–36)
CO2: 27 mmol/L (ref 20–29)
Calcium: 9.9 mg/dL (ref 8.7–10.3)
Chloride: 91 mmol/L — ABNORMAL LOW (ref 96–106)
Creatinine, Ser: 0.77 mg/dL (ref 0.57–1.00)
Glucose: 78 mg/dL (ref 70–99)
Phosphorus: 3.5 mg/dL (ref 3.0–4.3)
Potassium: 3.7 mmol/L (ref 3.5–5.2)
Sodium: 133 mmol/L — ABNORMAL LOW (ref 134–144)
eGFR: 73 mL/min/{1.73_m2} (ref >59)

## 2023-05-06 LAB — T4, FREE: Free T4: 1.86 ng/dL — ABNORMAL HIGH (ref 0.82–1.77)

## 2023-05-06 LAB — TSH: TSH: 1.36 u[IU]/mL (ref 0.450–4.500)

## 2023-05-10 ENCOUNTER — Other Ambulatory Visit: Payer: Self-pay | Admitting: Family Medicine

## 2023-06-08 ENCOUNTER — Other Ambulatory Visit: Payer: Self-pay | Admitting: Family Medicine

## 2023-06-08 DIAGNOSIS — I10 Essential (primary) hypertension: Secondary | ICD-10-CM

## 2023-06-08 NOTE — Telephone Encounter (Signed)
Requested Prescriptions  Pending Prescriptions Disp Refills   triamterene-hydrochlorothiazide (MAXZIDE) 75-50 MG tablet [Pharmacy Med Name: TRIAMTERENE-HCTZ 75-50 MG TAB] 90 tablet 0    Sig: TAKE 1 TABLET BY MOUTH DAILY     Cardiovascular: Diuretic Combos Failed - 06/08/2023 10:11 AM      Failed - Na in normal range and within 180 days    Sodium  Date Value Ref Range Status  05/05/2023 133 (L) 134 - 144 mmol/L Final  10/22/2011 139 136 - 145 mmol/L Final         Passed - K in normal range and within 180 days    Potassium  Date Value Ref Range Status  05/05/2023 3.7 3.5 - 5.2 mmol/L Final  10/22/2011 3.0 (L) 3.5 - 5.1 mmol/L Final         Passed - Cr in normal range and within 180 days    Creatinine  Date Value Ref Range Status  10/22/2011 0.80 0.60 - 1.30 mg/dL Final   Creatinine, Ser  Date Value Ref Range Status  05/05/2023 0.77 0.57 - 1.00 mg/dL Final         Passed - Last BP in normal range    BP Readings from Last 1 Encounters:  05/05/23 (!) 138/58         Passed - Valid encounter within last 6 months    Recent Outpatient Visits           1 month ago Hypothyroidism, unspecified type   [facility] [name], MD   7 months ago Essential hypertension   [place] [facility] [name], MD   1 year ago Essential hypertension   [place] [facility] [name], MD   1 year ago Essential (primary) hypertension   [place] [facility] Ko Olina, [place], PA-C   1 year ago Hyponatremia   [facility] [name], MD       Future Appointments             In 4 months Fisher, Demetrios Isaacs, MD [facility]

## 2023-07-12 DIAGNOSIS — H353131 Nonexudative age-related macular degeneration, bilateral, early dry stage: Secondary | ICD-10-CM | POA: Diagnosis not present

## 2023-07-12 DIAGNOSIS — Z961 Presence of intraocular lens: Secondary | ICD-10-CM | POA: Diagnosis not present

## 2023-07-21 DIAGNOSIS — I7091 Generalized atherosclerosis: Secondary | ICD-10-CM | POA: Diagnosis not present

## 2023-07-21 DIAGNOSIS — B351 Tinea unguium: Secondary | ICD-10-CM | POA: Diagnosis not present

## 2023-07-28 ENCOUNTER — Ambulatory Visit: Payer: PPO | Admitting: Emergency Medicine

## 2023-07-28 NOTE — Progress Notes (Signed)
 A user error has taken place: encounter opened in error, closed for administrative reasons.

## 2023-08-04 ENCOUNTER — Ambulatory Visit: Payer: PPO | Admitting: Emergency Medicine

## 2023-08-04 VITALS — Ht 61.0 in | Wt 99.0 lb

## 2023-08-04 DIAGNOSIS — Z0000 Encounter for general adult medical examination without abnormal findings: Secondary | ICD-10-CM

## 2023-08-04 DIAGNOSIS — Z780 Asymptomatic menopausal state: Secondary | ICD-10-CM | POA: Diagnosis not present

## 2023-08-04 NOTE — Progress Notes (Signed)
Subjective:   Isabel Myers is a 89 y.o. female who presents for Medicare Annual (Subsequent) preventive examination.  This patient declined Interactive audio and Acupuncturist. Therefore the visit was completed with audio only.   Visit Complete: Virtual I connected with  Estelle Grumbles on 08/04/23 by a audio enabled telemedicine application and verified that I am speaking with the correct person using two identifiers.  Patient Location: Home  Provider Location: Home Office  I discussed the limitations of evaluation and management by telemedicine. The patient expressed understanding and agreed to proceed.  Vital Signs: Because this visit was a virtual/telehealth visit, some criteria may be missing or patient reported. Any vitals not documented were not able to be obtained and vitals that have been documented are patient reported.   Cardiac Risk Factors include: advanced age (>92men, >47 women);hypertension     Objective:    Today's Vitals   08/04/23 1344  Weight: 99 lb (44.9 kg)  Height: 5\' 1"  (1.549 m)   Body mass index is 18.71 kg/m.     05/01/2023   11:13 AM 07/21/2022    2:50 PM 10/03/2021    4:43 PM 07/03/2021    9:19 AM 06/30/2021   11:28 AM 06/25/2020    2:39 PM 06/14/2019    3:06 PM  Advanced Directives  Does Patient Have a Medical Advance Directive? Yes Yes Yes Yes No Yes Yes  Type of Estate agent of [place];Living will Healthcare Power of [place];Living will Out of facility DNR (pink MOST or yellow form) Healthcare Power of [place];Living will  Healthcare Power of [place];Living will Healthcare Power of [place];Living will  Does patient want to make changes to medical advance directive?  No - Patient declined  Yes (Inpatient - patient defers changing a medical advance directive and declines information at this time)     Copy of Healthcare Power of Attorney in Chart?  Yes - validated most recent copy scanned in chart (See  row information)  Yes - validated most recent copy scanned in chart (See row information)  No - copy requested No - copy requested  Would patient like information on creating a medical advance directive?    No - Patient declined No - Patient declined      Current Medications (verified) Outpatient Encounter Medications as of 08/04/2023  Medication Sig   amLODipine (NORVASC) 2.5 MG tablet TAKE 1 TABLET BY MOUTH DAILY   Calcium Carbonate (CALCIUM 600 PO) Take 1 tablet by mouth daily.   levothyroxine (SYNTHROID) 75 MCG tablet TAKE 1 TABLET BY MOUTH ONCE DAILY. TAKE ON EMPTY STOMACH WITH A GLASS OF WATER AT LEAST 30-60 MINUTES BEFORE BREAKFAST   Multiple Vitamins-Minerals (PRESERVISION AREDS 2 PO) Take 2 tablets by mouth daily at 6 (six) AM.   triamterene-hydrochlorothiazide (MAXZIDE) 75-50 MG tablet TAKE 1 TABLET BY MOUTH DAILY   No facility-administered encounter medications on file as of 08/04/2023.    Allergies (verified) Amlodipine   History: Past Medical History:  Diagnosis Date   Hypertension    Thyroid disease    hypothyroid   Past Surgical History:  Procedure Laterality Date   ABDOMINAL HYSTERECTOMY  1987   with BSO   Bladder tack  2009   Dr. Chrissie Noa   BUNIONECTOMY     CATARACT EXTRACTION, BILATERAL     CT Cervical Spine  10/22/2011   negative   CT Scan of head  10/22/2011   without contrast, [facility], Normal   THYROIDECTOMY  1937   SUBTOTAL  TONSILLECTOMY AND ADENOIDECTOMY     Family History  Problem Relation Age of Onset   Memory loss Sister        short term memory loss   Social History   Socioeconomic History   Marital status: Widowed    Spouse name: Not on file   Number of children: 2   Years of education: Not on file   Highest education level: Master's degree (e.g., MA, MS, MEng, MEd, MSW, MBA)  Occupational History   Occupation: Retired  Tobacco Use   Smoking status: Former    Current packs/day: 0.00    Average packs/day: 0.3 packs/day for 14.0  years (3.5 ttl pk-yrs)    Types: Cigarettes    Start date: 59    Quit date: 1968    Years since quitting: 57.0   Smokeless tobacco: Never   Tobacco comments:    Quit in the 1970s  Vaping Use   Vaping status: Never Used  Substance and Sexual Activity   Alcohol use: Yes    Alcohol/week: 4.0 standard drinks of alcohol    Types: 4 Glasses of wine per week    Comment: 1 glass of wine 4 days a week   Drug use: No   Sexual activity: Not on file  Other Topics Concern   Not on file  Social History Narrative   Not on file   Social Drivers of Health   Financial Resource Strain: Low Risk  (08/04/2023)   Overall Financial Resource Strain (CARDIA)    Difficulty of Paying Living Expenses: Not hard at all  Food Insecurity: No Food Insecurity (08/04/2023)   Hunger Vital Sign    Worried About Running Out of Food in the Last Year: Never true    Ran Out of Food in the Last Year: Never true  Transportation Needs: No Transportation Needs (08/04/2023)   PRAPARE - Administrator, Civil Service (Medical): No    Lack of Transportation (Non-Medical): No  Physical Activity: Insufficiently Active (08/04/2023)   Exercise Vital Sign    Days of Exercise per Week: 3 days    Minutes of Exercise per Session: 20 min  Stress: No Stress Concern Present (08/04/2023)   [employer] of Occupational Health - Occupational Stress Questionnaire    Feeling of Stress : Not at all  Social Connections: Moderately Isolated (08/04/2023)   Social Connection and Isolation Panel [NHANES]    Frequency of Communication with Friends and Family: More than three times a week    Frequency of Social Gatherings with Friends and Family: More than three times a week    Attends Religious Services: More than 4 times per year    Active Member of [employer] or Organizations: No    Attends Banker Meetings: Never    Marital Status: Widowed    Tobacco Counseling Counseling given: Not Answered Tobacco comments:  Quit in the 1970s   Clinical Intake:  Pre-visit preparation completed: Yes  Pain : No/denies pain     BMI - recorded: 18.71 Nutritional Status: BMI <19  Underweight Nutritional Risks: None Diabetes: No  How often do you need to have someone help you when you read instructions, pamphlets, or other written materials from your doctor or pharmacy?: 1 - Never  Interpreter Needed?: No  Information entered by :: Tora Kindred, CMA   Activities of Daily Living    08/04/2023    1:48 PM 11/03/2022    9:43 AM  In your present state of health, do you have  any difficulty performing the following activities:  Hearing? 1 1  Comment wears hearing aids   Vision? 0 1  Difficulty concentrating or making decisions? 0 0  Walking or climbing stairs? 1 0  Comment cane and rollator   Dressing or bathing? 0 0  Doing errands, shopping? 0 0  Preparing Food and eating ? N   Using the Toilet? N   In the past six months, have you accidently leaked urine? N   Do you have problems with loss of bowel control? N   Managing your Medications? N   Managing your Finances? N   Housekeeping or managing your Housekeeping? Y   Comment has someone to clean every 2 weeks     Patient Care Team: [name], MD as PCP - General (Family Medicine) Dingeldein, Viviann Spare, MD (Ophthalmology) Fransico Michael, MD as Referring Physician (Audiology)  Indicate any recent Medical Services you may have received from other than [facility] providers in the past year (date may be approximate).     Assessment:   This is a routine wellness examination for Nasiyah.  Hearing/Vision screen Hearing Screening - Comments:: Wears hearing aids Vision Screening - Comments:: Gets routine eye exam. Dr. Dellie Burns @ [facility]   Goals Addressed             This Visit's Progress    Patient Stated       Maintain current health      Depression Screen    08/04/2023    2:01 PM 11/03/2022    9:43 AM 07/21/2022    2:48 PM  04/28/2022    9:51 AM 07/03/2021    9:18 AM 09/17/2020    9:25 AM 06/25/2020    2:31 PM  PHQ 2/9 Scores  PHQ - 2 Score 0 0 0 0 0 0 0  PHQ- 9 Score  0 0 0  0     Fall Risk    08/04/2023    1:54 PM 11/03/2022    9:43 AM 07/21/2022    2:51 PM 04/28/2022    9:51 AM 07/03/2021    9:20 AM  Fall Risk   Falls in the past year? 1 0 1 1 1   Number falls in past yr: 1 0 0 0 0  Injury with Fall? 0 0 0 0 1  Risk for fall due to : History of fall(s);Impaired balance/gait;Orthopedic patient;Impaired mobility No Fall Risks History of fall(s)  History of fall(s)  Follow up Education provided;Falls prevention discussed;Falls evaluation completed Falls evaluation completed Falls evaluation completed;Falls prevention discussed  Falls prevention discussed    MEDICARE RISK AT HOME: Medicare Risk at Home Any stairs in or around the home?: No If so, are there any without handrails?: No Home free of loose throw rugs in walkways, pet beds, electrical cords, etc?: Yes Adequate lighting in your home to reduce risk of falls?: Yes Life alert?: Yes Use of a cane, walker or w/c?: Yes (cane and rollator) Grab bars in the bathroom?: No Shower chair or bench in shower?: No Elevated toilet seat or a handicapped toilet?: No  TIMED UP AND GO:  Was the test performed?  No    Cognitive Function:        08/04/2023    2:03 PM 07/21/2022    2:53 PM 06/02/2018    2:27 PM 05/31/2017   11:25 AM  6CIT Screen  What Year? 0 points 0 points 0 points 0 points  What month? 0 points 0 points 0 points  What time? 0 points 0 points 0 points 0 points  Count back from 20 0 points 0 points 0 points 0 points  Months in reverse 0 points 0 points 0 points 0 points  Repeat phrase 0 points 0 points 0 points 0 points  Total Score 0 points 0 points 0 points     Immunizations Immunization History  Administered Date(s) Administered   DTaP 07/28/1993   Influenza, High Dose Seasonal PF 04/12/2020   Influenza-Unspecified  04/12/2016, 04/23/2018, 04/22/2022, 04/14/2023   Moderna Sars-Covid-2 Vaccination 07/25/2019, 08/23/2019, 05/23/2020, 11/22/2020   Pneumococcal Conjugate-13 04/06/2014   Pneumococcal Polysaccharide-23 07/28/2006   Td 08/09/2007   Tdap 11/30/2011   Zoster Recombinant(Shingrix) 06/29/2019   Zoster, Live 03/05/2006    TDAP status: Due, Education has been provided regarding the importance of this vaccine. Advised may receive this vaccine at local pharmacy or Health Dept. Aware to provide a copy of the vaccination record if obtained from local pharmacy or Health Dept. Verbalized acceptance and understanding.  Flu Vaccine status: Up to date  Pneumococcal vaccine status: Up to date  Covid-19 vaccine status: Completed vaccines  Qualifies for Shingles Vaccine? Yes   Zostavax completed Yes   Shingrix Completed?: Yes  Screening Tests Health Maintenance  Topic Date Due   Zoster Vaccines- Shingrix (2 of 2) 08/24/2019   DTaP/Tdap/Td (4 - Td or Tdap) 11/29/2021   DEXA SCAN  09/15/2022   COVID-19 Vaccine (5 - 2024-25 season) 03/14/2023   Medicare Annual Wellness (AWV)  08/03/2024   Pneumonia Vaccine 96+ Years old  Completed   INFLUENZA VACCINE  Completed   HPV VACCINES  Aged Out    Health Maintenance  Health Maintenance Due  Topic Date Due   Zoster Vaccines- Shingrix (2 of 2) 08/24/2019   DTaP/Tdap/Td (4 - Td or Tdap) 11/29/2021   DEXA SCAN  09/15/2022   COVID-19 Vaccine (5 - 2024-25 season) 03/14/2023    Colorectal cancer screening: No longer required.   Mammogram status: No longer required due to age.  Bone Density status: Ordered 08/04/23. Pt provided with contact info and advised to call to schedule appt.  Lung Cancer Screening: (Low Dose CT Chest recommended if Age 64-80 years, 20 pack-year currently smoking OR have quit w/in 15years.) does not qualify.   Lung Cancer Screening Referral: n/a  Additional Screening:  Hepatitis C Screening: does not qualify;   Vision  Screening: Recommended annual ophthalmology exams for early detection of glaucoma and other disorders of the eye.  Dental Screening: Recommended annual dental exams for proper oral hygiene   Community Resource Referral / Chronic Care Management: CRR required this visit?  No   CCM required this visit?  No     Plan:     I have personally reviewed and noted the following in the patient's chart:   Medical and social history Use of alcohol, tobacco or illicit drugs  Current medications and supplements including opioid prescriptions. Patient is not currently taking opioid prescriptions. Functional ability and status Nutritional status Physical activity Advanced directives List of other physicians Hospitalizations, surgeries, and ER visits in previous 12 months Vitals Screenings to include cognitive, depression, and falls Referrals and appointments  In addition, I have reviewed and discussed with patient certain preventive protocols, quality metrics, and best practice recommendations. A written personalized care plan for preventive services as well as general preventive health recommendations were provided to patient.     Tora Kindred, CMA   08/04/2023   After Visit Summary: (Mail) Due to this being  a telephonic visit, the after visit summary with patients personalized plan was offered to patient via mail   Nurse Notes:  Needs Tdap vaccine Placed order for DEXA Scan

## 2023-08-04 NOTE — Patient Instructions (Addendum)
[name] , Thank you for taking time to come for your Medicare Wellness Visit. I appreciate your ongoing commitment to your health goals. Please review the following plan we discussed and let me know if I can assist you in the future.   Referrals/Orders/Follow-Ups/Clinician Recommendations: Get a tetanus vaccine at your local pharmacy at your convenience. I have placed an order for a bone density test. Call [facility] @ [phone] to schedule at your convenience.  This is a list of the screening recommended for you and due dates:  Health Maintenance  Topic Date Due   Zoster (Shingles) Vaccine (2 of 2) 08/24/2019   DTaP/Tdap/Td vaccine (4 - Td or Tdap) 11/29/2021   DEXA scan (bone density measurement)  09/15/2022   COVID-19 Vaccine (5 - 2024-25 season) 03/14/2023   Medicare Annual Wellness Visit  08/03/2024   Pneumonia Vaccine  Completed   Flu Shot  Completed   HPV Vaccine  Aged Out    Advanced directives: (In Chart) A copy of your advanced directives are scanned into your chart should your provider ever need it.  Next Medicare Annual Wellness Visit scheduled for next year: Yes, 08/09/24 @ 1:10pm (phone visit)  Fall Prevention in the Home, Adult Falls can cause injuries and affect people of all ages. There are many simple things that you can do to make your home safe and to help prevent falls. If you need it, ask for help making these changes. What actions can I take to prevent falls? General information Use good lighting in all rooms. Make sure to: Replace any light bulbs that burn out. Turn on lights if it is dark and use night-lights. Keep items that you use often in easy-to-reach places. Lower the shelves around your home if needed. Move furniture so that there are clear paths around it. Do not keep throw rugs or other things on the floor that can make you trip. If any of your floors are uneven, fix them. Add color or contrast paint or tape to clearly mark and help  you see: Grab bars or handrails. First and last steps of staircases. Where the edge of each step is. If you use a ladder or stepladder: Make sure that it is fully opened. Do not climb a closed ladder. Make sure the sides of the ladder are locked in place. Have someone hold the ladder while you use it. Know where your pets are as you move through your home. What can I do in the bathroom?     Keep the floor dry. Clean up any water that is on the floor right away. Remove soap buildup in the bathtub or shower. Buildup makes bathtubs and showers slippery. Use non-skid mats or decals on the floor of the bathtub or shower. Attach bath mats securely with double-sided, non-slip rug tape. If you need to sit down while you are in the shower, use a non-slip stool. Install grab bars by the toilet and in the bathtub and shower. Do not use towel bars as grab bars. What can I do in the bedroom? Make sure that you have a light by your bed that is easy to reach. Do not use any sheets or blankets on your bed that hang to the floor. Have a firm bench or chair with side arms that you can use for support when you get dressed. What can I do in the kitchen? Clean up any spills right away. If you need to reach something above you, use a sturdy step  stool that has a grab bar. Keep electrical cables out of the way. Do not use floor polish or wax that makes floors slippery. What can I do with my stairs? Do not leave anything on the stairs. Make sure that you have a light switch at the top and the bottom of the stairs. Have them installed if you do not have them. Make sure that there are handrails on both sides of the stairs. Fix handrails that are broken or loose. Make sure that handrails are as long as the staircases. Install non-slip stair treads on all stairs in your home if they do not have carpet. Avoid having throw rugs at the top or bottom of stairs, or secure the rugs with carpet tape to prevent them  from moving. Choose a carpet design that does not hide the edge of steps on the stairs. Make sure that carpet is firmly attached to the stairs. Fix any carpet that is loose or worn. What can I do on the outside of my home? Use bright outdoor lighting. Repair the edges of walkways and driveways and fix any cracks. Clear paths of anything that can make you trip, such as tools or rocks. Add color or contrast paint or tape to clearly mark and help you see high doorway thresholds. Trim any bushes or trees on the main path into your home. Check that handrails are securely fastened and in good repair. Both sides of all steps should have handrails. Install guardrails along the edges of any raised decks or porches. Have leaves, snow, and ice cleared regularly. Use sand, salt, or ice melt on walkways during winter months if you live where there is ice and snow. In the garage, clean up any spills right away, including grease or oil spills. What other actions can I take? Review your medicines with your health care provider. Some medicines can make you confused or feel dizzy. This can increase your chance of falling. Wear closed-toe shoes that fit well and support your feet. Wear shoes that have rubber soles and low heels. Use a cane, walker, scooter, or crutches that help you move around if needed. Talk with your provider about other ways that you can decrease your risk of falls. This may include seeing a physical therapist to learn to do exercises to improve movement and strength. Where to find more information Centers for Disease Control and Prevention, STEADI: TonerPromos.no [employer] on Aging: BaseRingTones.pl National Institute on Aging: BaseRingTones.pl Contact a health care provider if: You are afraid of falling at home. You feel weak, drowsy, or dizzy at home. You fall at home. Get help right away if you: Lose consciousness or have trouble moving after a fall. Have a fall that causes a head  injury. These symptoms may be an emergency. Get help right away. Call 911. Do not wait to see if the symptoms will go away. Do not drive yourself to the hospital. This information is not intended to replace advice given to you by your health care provider. Make sure you discuss any questions you have with your health care provider. Document Revised: 03/02/2022 Document Reviewed: 03/02/2022 Elsevier Patient Education  2024 [employer].

## 2023-08-09 ENCOUNTER — Telehealth: Payer: Self-pay | Admitting: Family Medicine

## 2023-08-09 ENCOUNTER — Telehealth: Payer: Self-pay

## 2023-08-09 DIAGNOSIS — E2839 Other primary ovarian failure: Secondary | ICD-10-CM

## 2023-08-09 NOTE — Telephone Encounter (Signed)
Copied from CRM [id number]. Topic: Referral - Request for Referral >> Aug 09, 2023 11:38 AM Priscille Loveless wrote: Reason for CRM: Pt had medicare wellness last week and they suggested that she have a bone density test done. She is calling in for a referral for that.

## 2023-08-19 ENCOUNTER — Ambulatory Visit
Admission: RE | Admit: 2023-08-19 | Discharge: 2023-08-19 | Disposition: A | Discharge status: Home / Self Care | Payer: PPO | Source: Ambulatory Visit | Attending: Family Medicine | Admitting: Family Medicine

## 2023-08-19 DIAGNOSIS — Z780 Asymptomatic menopausal state: Secondary | ICD-10-CM | POA: Diagnosis not present

## 2023-08-19 DIAGNOSIS — M85832 Other specified disorders of bone density and structure, left forearm: Secondary | ICD-10-CM | POA: Diagnosis not present

## 2023-09-07 ENCOUNTER — Other Ambulatory Visit: Payer: Self-pay | Admitting: Family Medicine

## 2023-09-07 DIAGNOSIS — I10 Essential (primary) hypertension: Secondary | ICD-10-CM

## 2023-09-07 NOTE — Telephone Encounter (Unsigned)
 Copied from CRM [id number]. Topic: Clinical - Medication Refill >> Sep 07, 2023  9:05 AM Higinio Roger wrote: Most Recent Primary Care Visit:  Provider: Tora Kindred  Department: [facility]  Visit Type: MEDICARE AWV, SEQUENTIAL  Date: 08/04/2023  Medication: triamterene-hydrochlorothiazide (MAXZIDE) 75-50 MG tablet   Has the patient contacted their pharmacy? Yes (Agent: If no, request that the patient contact the pharmacy for the refill. If patient does not wish to contact the pharmacy document the reason why and proceed with request.) (Agent: If yes, when and what did the pharmacy advise?) Pharmacy stated they were out of refills  Is this the correct pharmacy for this prescription? Yes If no, delete pharmacy and type the correct one.  This is the patient's preferred pharmacy:   [facility] - [place], [place] - [address] Reesa Chew [place] [address] Phone: [phone] Fax: [phone]   Has the prescription been filled recently? No  Is the patient out of the medication? Yes  Has the patient been seen for an appointment in the last year OR does the patient have an upcoming appointment? Yes  Can we respond through MyChart? No  Agent: Please be advised that Rx refills may take up to 3 business days. We ask that you follow-up with your pharmacy.

## 2023-09-08 NOTE — Telephone Encounter (Signed)
 Requested Prescriptions  Pending Prescriptions Disp Refills   triamterene-hydrochlorothiazide (MAXZIDE) 75-50 MG tablet [Pharmacy Med Name: TRIAMTERENE-HCTZ 75-50 MG TAB] 90 tablet 0    Sig: TAKE 1 TABLET BY MOUTH DAILY     Cardiovascular: Diuretic Combos Failed - 09/08/2023 10:25 AM      Failed - Na in normal range and within 180 days    Sodium  Date Value Ref Range Status  05/05/2023 133 (L) 134 - 144 mmol/L Final  10/22/2011 139 136 - 145 mmol/L Final         Passed - K in normal range and within 180 days    Potassium  Date Value Ref Range Status  05/05/2023 3.7 3.5 - 5.2 mmol/L Final  10/22/2011 3.0 (L) 3.5 - 5.1 mmol/L Final         Passed - Cr in normal range and within 180 days    Creatinine  Date Value Ref Range Status  10/22/2011 0.80 0.60 - 1.30 mg/dL Final   Creatinine, Ser  Date Value Ref Range Status  05/05/2023 0.77 0.57 - 1.00 mg/dL Final         Passed - Last BP in normal range    BP Readings from Last 1 Encounters:  05/05/23 (!) 138/58         Passed - Valid encounter within last 6 months    Recent Outpatient Visits           4 months ago Hypothyroidism, unspecified type   [facility] [name], MD   10 months ago Essential hypertension   [facility] [name], MD   1 year ago Essential hypertension   [facility] [name], MD   1 year ago Essential (primary) hypertension   [facility] [place], [place], PA-C   1 year ago Hyponatremia   [facility] [name], MD       Future Appointments             In 1 month Fisher, Demetrios Isaacs, MD [facility]

## 2023-11-03 ENCOUNTER — Encounter: Payer: Self-pay | Admitting: Family Medicine

## 2023-11-03 ENCOUNTER — Ambulatory Visit (INDEPENDENT_AMBULATORY_CARE_PROVIDER_SITE_OTHER): Payer: Self-pay | Admitting: Family Medicine

## 2023-11-03 VITALS — BP 150/72 | HR 71 | Resp 16 | Ht 61.0 in | Wt 99.6 lb

## 2023-11-03 DIAGNOSIS — M150 Primary generalized (osteo)arthritis: Secondary | ICD-10-CM | POA: Diagnosis not present

## 2023-11-03 DIAGNOSIS — M5382 Other specified dorsopathies, cervical region: Secondary | ICD-10-CM

## 2023-11-03 DIAGNOSIS — E039 Hypothyroidism, unspecified: Secondary | ICD-10-CM

## 2023-11-03 DIAGNOSIS — I10 Essential (primary) hypertension: Secondary | ICD-10-CM | POA: Diagnosis not present

## 2023-11-03 NOTE — Patient Instructions (Signed)
 Isabel Myers  Please review the attached list of medications and notify my office if there are any errors.   . Please bring all of your medications to every appointment so we can make sure that our medication list is the same as yours.

## 2023-11-04 ENCOUNTER — Telehealth: Payer: Self-pay | Admitting: Family Medicine

## 2023-11-04 NOTE — Telephone Encounter (Signed)
 Copied from CRM [id number]. Topic: Referral - Request for Referral >> Nov 04, 2023 11:36 AM Turkey B wrote: Did the patient discuss referral with their provider in the last year? yes    Type of order/referral and detailed reason for visit: asking for referral for her not being able to hold her head up because of arthritis  Preference of office, provider, location: [phone] and ask for Shirl Dose, pt dindt give the actual name of Location  If referral order, have you been seen by this specialty before? no  Can we respond through MyChart? no

## 2023-11-07 NOTE — Telephone Encounter (Signed)
 Order for PT/OT was signed and sent to be faxed.

## 2023-11-08 ENCOUNTER — Encounter: Payer: Self-pay | Admitting: Family Medicine

## 2023-11-08 NOTE — Progress Notes (Signed)
      Established patient visit   Patient: Isabel Myers   DOB: July 18, 1929   89 y.o. Female  MRN: [id number] Visit Date: 11/03/2023  Today's healthcare provider: Jeralene Mom, MD   Chief Complaint  Patient presents with   Follow-up    6 mth f/u .Aaron Aas Has been having trouble holding head up for the last 3 months.Lost sister a week ago.   Subjective    HPI Presents today for follow up hypertension, hypothyroid and arthritis. Feels well. She maintains an active lifestyle, trying to walk regularly, especially before the weather gets too hot.Having some pain and soreness in her neck for OA and having trouble holding her neck up at times. Takes medications consistently. Home blood pressures usually in the 120s-130s.   Lab Results  Component Value Date   NA 133 (L) 05/05/2023   K 3.7 05/05/2023   CREATININE 0.77 05/05/2023   EGFR 73 05/05/2023   GLUCOSE 78 05/05/2023   Lab Results  Component Value Date   TSH 1.360 05/05/2023     Medications: Outpatient Medications Prior to Visit  Medication Sig   amLODipine  (NORVASC ) 2.5 MG tablet TAKE 1 TABLET BY MOUTH DAILY   Calcium Carbonate (CALCIUM 600 PO) Take 1 tablet by mouth daily.   levothyroxine  (SYNTHROID ) 75 MCG tablet TAKE 1 TABLET BY MOUTH ONCE DAILY. TAKE ON EMPTY STOMACH WITH A GLASS OF WATER AT LEAST 30-60 MINUTES BEFORE BREAKFAST   Multiple Vitamins-Minerals (PRESERVISION AREDS 2 PO) Take 2 tablets by mouth daily at 6 (six) AM.   triamterene -hydrochlorothiazide  (MAXZIDE) 75-50 MG tablet TAKE 1 TABLET BY MOUTH DAILY   No facility-administered medications prior to visit.    Review of Systems  Constitutional:  Negative for appetite change, chills, fatigue and fever.  Respiratory:  Negative for chest tightness and shortness of breath.   Cardiovascular:  Negative for chest pain and palpitations.  Gastrointestinal:  Negative for abdominal pain, nausea and vomiting.  Neurological:  Negative for dizziness and weakness.        Objective    BP (!) 150/72 (BP Location: Left Arm, Patient Position: Sitting)   Pulse 71   Resp 16   Ht 5\' 1"  (1.549 m)   Wt 99 lb 9.6 oz (45.2 kg)   SpO2 98%   BMI 18.82 kg/m    Physical Exam   General appearance: Thin female, cooperative and in no acute distress Head: Normocephalic, without obvious abnormality, atraumatic Respiratory: Respirations even and unlabored, normal respiratory rate Extremities: All extremities are intact.  Skin: Skin color, texture, turgor normal. No rashes seen  Psych: Appropriate mood and affect. Neurologic: Mental status: Alert, oriented to person, place, and time, thought content appropriate.    Assessment & Plan    1. Essential hypertension (Primary) BP up in office today, but home Bps well controlled.   2. Hypothyroidism, unspecified type Clinically euthyroid. Check labs at next visit in 6 months.   3. Primary osteoarthritis involving multiple joints  4. Neck muscle weakness Likely due to OA. Consider physical therapy.    Return in about 6 months (around 05/04/2024) for Hypertension and hypothyroid.         Jeralene Mom, MD  [facility] [phone] (phone) [phone] (fax)  [facility]

## 2023-11-11 DIAGNOSIS — R2681 Unsteadiness on feet: Secondary | ICD-10-CM | POA: Diagnosis not present

## 2023-11-11 DIAGNOSIS — R531 Weakness: Secondary | ICD-10-CM | POA: Diagnosis not present

## 2023-11-11 DIAGNOSIS — R2689 Other abnormalities of gait and mobility: Secondary | ICD-10-CM | POA: Diagnosis not present

## 2023-11-11 DIAGNOSIS — M2559 Pain in other specified joint: Secondary | ICD-10-CM | POA: Diagnosis not present

## 2023-11-11 DIAGNOSIS — M6281 Muscle weakness (generalized): Secondary | ICD-10-CM | POA: Diagnosis not present

## 2023-11-16 DIAGNOSIS — M2559 Pain in other specified joint: Secondary | ICD-10-CM | POA: Diagnosis not present

## 2023-11-16 DIAGNOSIS — M6281 Muscle weakness (generalized): Secondary | ICD-10-CM | POA: Diagnosis not present

## 2023-11-16 DIAGNOSIS — R531 Weakness: Secondary | ICD-10-CM | POA: Diagnosis not present

## 2023-11-16 DIAGNOSIS — R2689 Other abnormalities of gait and mobility: Secondary | ICD-10-CM | POA: Diagnosis not present

## 2023-11-16 DIAGNOSIS — R2681 Unsteadiness on feet: Secondary | ICD-10-CM | POA: Diagnosis not present

## 2023-11-18 DIAGNOSIS — M2559 Pain in other specified joint: Secondary | ICD-10-CM | POA: Diagnosis not present

## 2023-11-18 DIAGNOSIS — R2689 Other abnormalities of gait and mobility: Secondary | ICD-10-CM | POA: Diagnosis not present

## 2023-11-18 DIAGNOSIS — R531 Weakness: Secondary | ICD-10-CM | POA: Diagnosis not present

## 2023-11-18 DIAGNOSIS — R2681 Unsteadiness on feet: Secondary | ICD-10-CM | POA: Diagnosis not present

## 2023-11-18 DIAGNOSIS — M6281 Muscle weakness (generalized): Secondary | ICD-10-CM | POA: Diagnosis not present

## 2023-11-23 DIAGNOSIS — R2689 Other abnormalities of gait and mobility: Secondary | ICD-10-CM | POA: Diagnosis not present

## 2023-11-23 DIAGNOSIS — M2559 Pain in other specified joint: Secondary | ICD-10-CM | POA: Diagnosis not present

## 2023-11-23 DIAGNOSIS — M6281 Muscle weakness (generalized): Secondary | ICD-10-CM | POA: Diagnosis not present

## 2023-11-23 DIAGNOSIS — R2681 Unsteadiness on feet: Secondary | ICD-10-CM | POA: Diagnosis not present

## 2023-11-23 DIAGNOSIS — R531 Weakness: Secondary | ICD-10-CM | POA: Diagnosis not present

## 2023-11-25 DIAGNOSIS — M6281 Muscle weakness (generalized): Secondary | ICD-10-CM | POA: Diagnosis not present

## 2023-11-25 DIAGNOSIS — R2681 Unsteadiness on feet: Secondary | ICD-10-CM | POA: Diagnosis not present

## 2023-11-25 DIAGNOSIS — M2559 Pain in other specified joint: Secondary | ICD-10-CM | POA: Diagnosis not present

## 2023-11-25 DIAGNOSIS — R2689 Other abnormalities of gait and mobility: Secondary | ICD-10-CM | POA: Diagnosis not present

## 2023-11-25 DIAGNOSIS — R531 Weakness: Secondary | ICD-10-CM | POA: Diagnosis not present

## 2023-11-30 DIAGNOSIS — R531 Weakness: Secondary | ICD-10-CM | POA: Diagnosis not present

## 2023-11-30 DIAGNOSIS — M6281 Muscle weakness (generalized): Secondary | ICD-10-CM | POA: Diagnosis not present

## 2023-11-30 DIAGNOSIS — R2681 Unsteadiness on feet: Secondary | ICD-10-CM | POA: Diagnosis not present

## 2023-11-30 DIAGNOSIS — M2559 Pain in other specified joint: Secondary | ICD-10-CM | POA: Diagnosis not present

## 2023-11-30 DIAGNOSIS — R2689 Other abnormalities of gait and mobility: Secondary | ICD-10-CM | POA: Diagnosis not present

## 2023-12-02 DIAGNOSIS — M2559 Pain in other specified joint: Secondary | ICD-10-CM | POA: Diagnosis not present

## 2023-12-02 DIAGNOSIS — R531 Weakness: Secondary | ICD-10-CM | POA: Diagnosis not present

## 2023-12-02 DIAGNOSIS — R2681 Unsteadiness on feet: Secondary | ICD-10-CM | POA: Diagnosis not present

## 2023-12-02 DIAGNOSIS — R2689 Other abnormalities of gait and mobility: Secondary | ICD-10-CM | POA: Diagnosis not present

## 2023-12-02 DIAGNOSIS — M6281 Muscle weakness (generalized): Secondary | ICD-10-CM | POA: Diagnosis not present

## 2023-12-06 ENCOUNTER — Other Ambulatory Visit: Payer: Self-pay | Admitting: Family Medicine

## 2023-12-06 DIAGNOSIS — I10 Essential (primary) hypertension: Secondary | ICD-10-CM

## 2023-12-07 DIAGNOSIS — M2559 Pain in other specified joint: Secondary | ICD-10-CM | POA: Diagnosis not present

## 2023-12-07 DIAGNOSIS — M6281 Muscle weakness (generalized): Secondary | ICD-10-CM | POA: Diagnosis not present

## 2023-12-07 DIAGNOSIS — R2681 Unsteadiness on feet: Secondary | ICD-10-CM | POA: Diagnosis not present

## 2023-12-07 DIAGNOSIS — R2689 Other abnormalities of gait and mobility: Secondary | ICD-10-CM | POA: Diagnosis not present

## 2023-12-07 DIAGNOSIS — R531 Weakness: Secondary | ICD-10-CM | POA: Diagnosis not present

## 2023-12-09 DIAGNOSIS — R2689 Other abnormalities of gait and mobility: Secondary | ICD-10-CM | POA: Diagnosis not present

## 2023-12-09 DIAGNOSIS — M6281 Muscle weakness (generalized): Secondary | ICD-10-CM | POA: Diagnosis not present

## 2023-12-09 DIAGNOSIS — R531 Weakness: Secondary | ICD-10-CM | POA: Diagnosis not present

## 2023-12-09 DIAGNOSIS — M2559 Pain in other specified joint: Secondary | ICD-10-CM | POA: Diagnosis not present

## 2023-12-09 DIAGNOSIS — R2681 Unsteadiness on feet: Secondary | ICD-10-CM | POA: Diagnosis not present

## 2023-12-14 DIAGNOSIS — R2689 Other abnormalities of gait and mobility: Secondary | ICD-10-CM | POA: Diagnosis not present

## 2023-12-14 DIAGNOSIS — M2559 Pain in other specified joint: Secondary | ICD-10-CM | POA: Diagnosis not present

## 2023-12-14 DIAGNOSIS — R531 Weakness: Secondary | ICD-10-CM | POA: Diagnosis not present

## 2023-12-14 DIAGNOSIS — M6281 Muscle weakness (generalized): Secondary | ICD-10-CM | POA: Diagnosis not present

## 2023-12-14 DIAGNOSIS — R2681 Unsteadiness on feet: Secondary | ICD-10-CM | POA: Diagnosis not present

## 2023-12-16 DIAGNOSIS — R2689 Other abnormalities of gait and mobility: Secondary | ICD-10-CM | POA: Diagnosis not present

## 2023-12-16 DIAGNOSIS — R2681 Unsteadiness on feet: Secondary | ICD-10-CM | POA: Diagnosis not present

## 2023-12-16 DIAGNOSIS — R531 Weakness: Secondary | ICD-10-CM | POA: Diagnosis not present

## 2023-12-16 DIAGNOSIS — M2559 Pain in other specified joint: Secondary | ICD-10-CM | POA: Diagnosis not present

## 2023-12-16 DIAGNOSIS — M6281 Muscle weakness (generalized): Secondary | ICD-10-CM | POA: Diagnosis not present

## 2023-12-21 DIAGNOSIS — M6281 Muscle weakness (generalized): Secondary | ICD-10-CM | POA: Diagnosis not present

## 2023-12-21 DIAGNOSIS — R2681 Unsteadiness on feet: Secondary | ICD-10-CM | POA: Diagnosis not present

## 2023-12-21 DIAGNOSIS — R2689 Other abnormalities of gait and mobility: Secondary | ICD-10-CM | POA: Diagnosis not present

## 2023-12-21 DIAGNOSIS — M2559 Pain in other specified joint: Secondary | ICD-10-CM | POA: Diagnosis not present

## 2023-12-21 DIAGNOSIS — R531 Weakness: Secondary | ICD-10-CM | POA: Diagnosis not present

## 2023-12-22 DIAGNOSIS — Z961 Presence of intraocular lens: Secondary | ICD-10-CM | POA: Diagnosis not present

## 2023-12-22 DIAGNOSIS — H353131 Nonexudative age-related macular degeneration, bilateral, early dry stage: Secondary | ICD-10-CM | POA: Diagnosis not present

## 2023-12-23 DIAGNOSIS — M6281 Muscle weakness (generalized): Secondary | ICD-10-CM | POA: Diagnosis not present

## 2023-12-23 DIAGNOSIS — R2681 Unsteadiness on feet: Secondary | ICD-10-CM | POA: Diagnosis not present

## 2023-12-23 DIAGNOSIS — R2689 Other abnormalities of gait and mobility: Secondary | ICD-10-CM | POA: Diagnosis not present

## 2023-12-23 DIAGNOSIS — M2559 Pain in other specified joint: Secondary | ICD-10-CM | POA: Diagnosis not present

## 2023-12-23 DIAGNOSIS — R531 Weakness: Secondary | ICD-10-CM | POA: Diagnosis not present

## 2023-12-28 DIAGNOSIS — R2689 Other abnormalities of gait and mobility: Secondary | ICD-10-CM | POA: Diagnosis not present

## 2023-12-28 DIAGNOSIS — R2681 Unsteadiness on feet: Secondary | ICD-10-CM | POA: Diagnosis not present

## 2023-12-28 DIAGNOSIS — M2559 Pain in other specified joint: Secondary | ICD-10-CM | POA: Diagnosis not present

## 2023-12-28 DIAGNOSIS — R531 Weakness: Secondary | ICD-10-CM | POA: Diagnosis not present

## 2023-12-28 DIAGNOSIS — M6281 Muscle weakness (generalized): Secondary | ICD-10-CM | POA: Diagnosis not present

## 2023-12-30 DIAGNOSIS — R2689 Other abnormalities of gait and mobility: Secondary | ICD-10-CM | POA: Diagnosis not present

## 2023-12-30 DIAGNOSIS — R531 Weakness: Secondary | ICD-10-CM | POA: Diagnosis not present

## 2023-12-30 DIAGNOSIS — M2559 Pain in other specified joint: Secondary | ICD-10-CM | POA: Diagnosis not present

## 2023-12-30 DIAGNOSIS — M6281 Muscle weakness (generalized): Secondary | ICD-10-CM | POA: Diagnosis not present

## 2023-12-30 DIAGNOSIS — R2681 Unsteadiness on feet: Secondary | ICD-10-CM | POA: Diagnosis not present

## 2024-01-14 IMAGING — CT CT CHEST W/ CM
2 of 4 series · 15 of 36 positions shown, 18 images · IV contrast (agent unspecified)
Comparison: Chest radiograph dated 10/03/2021.

CLINICAL DATA: Abnormal chest radiograph.  Pulmonary nodule.

EXAM:
CT CHEST WITH CONTRAST
TECHNIQUE: Multidetector CT imaging of the chest was performed during
intravenous contrast administration.

[Series 2: axial st · axial · 0.63mm/px · z∈[+427,+721]mm · 12 of 175 slices shown, 15 images]
[im 14/175  mediastinal]
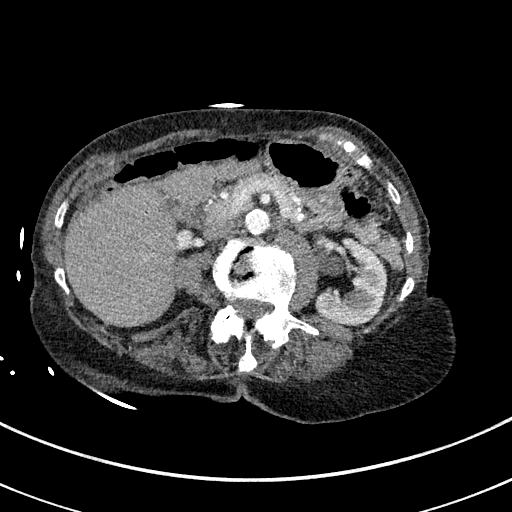
[im 14/175  lung]
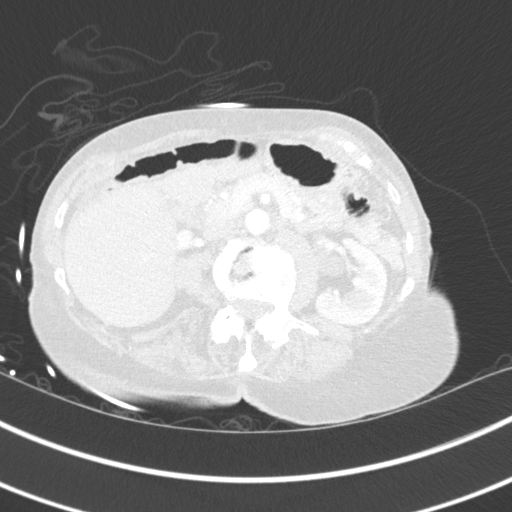
[im 27/175  lung]
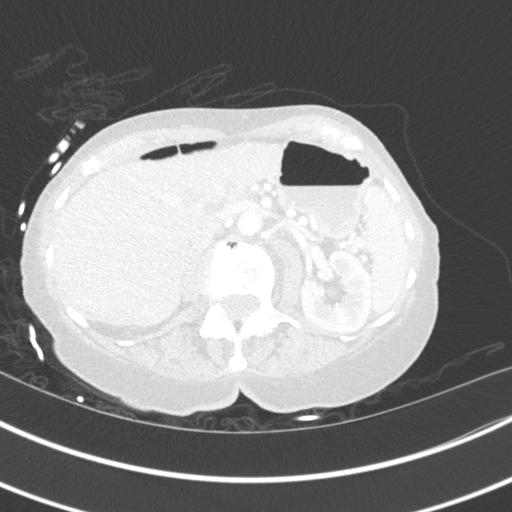
[im 41/175  lung]
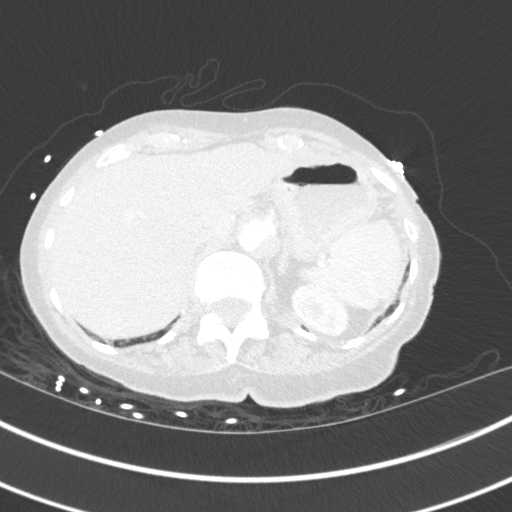
[im 54/175  lung]
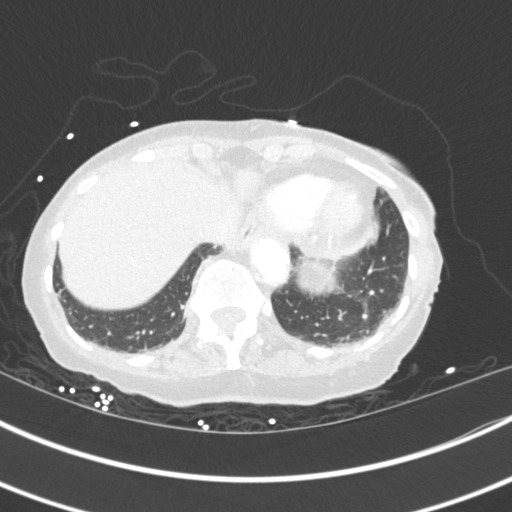
[im 67/175  mediastinal]
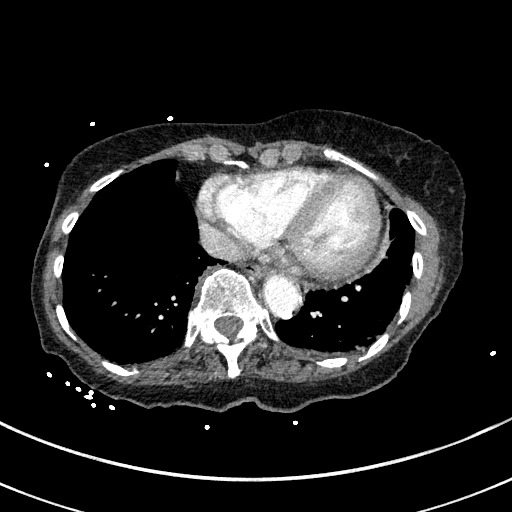
[im 67/175  lung]
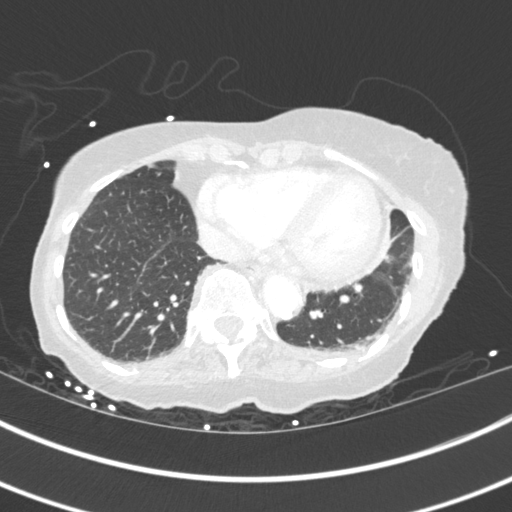
[im 81/175  lung]
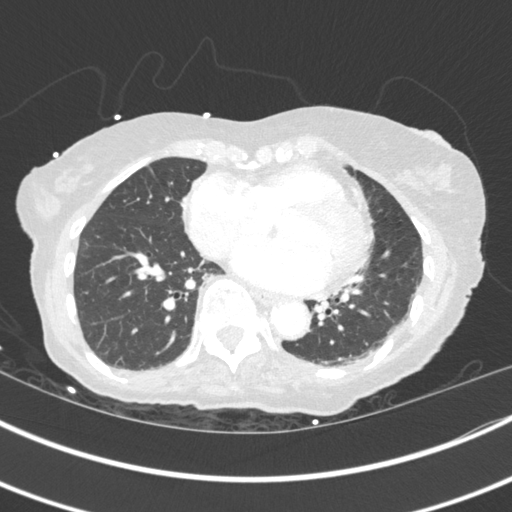
[im 94/175  lung]
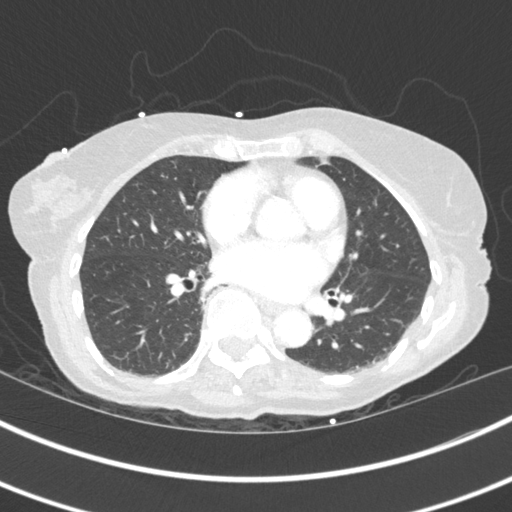
[im 108/175  lung]
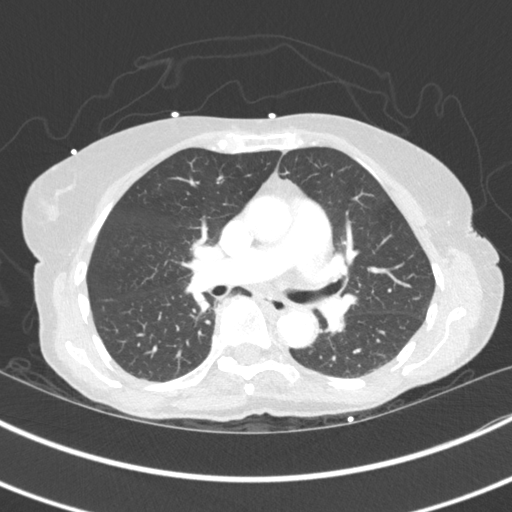
[im 121/175  mediastinal]
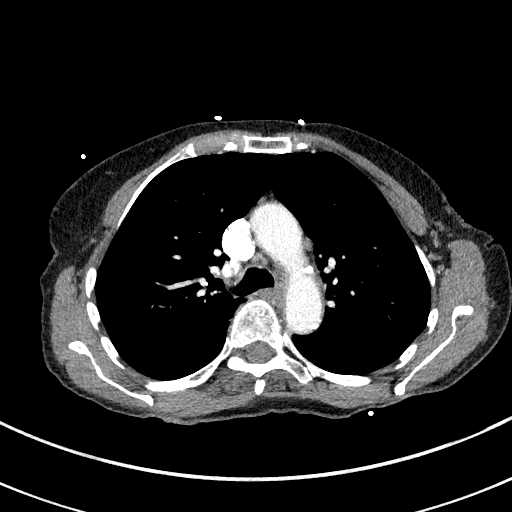
[im 121/175  lung]
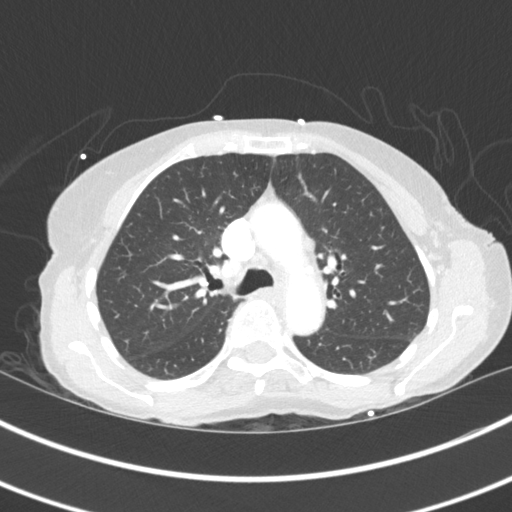
[im 134/175  lung]
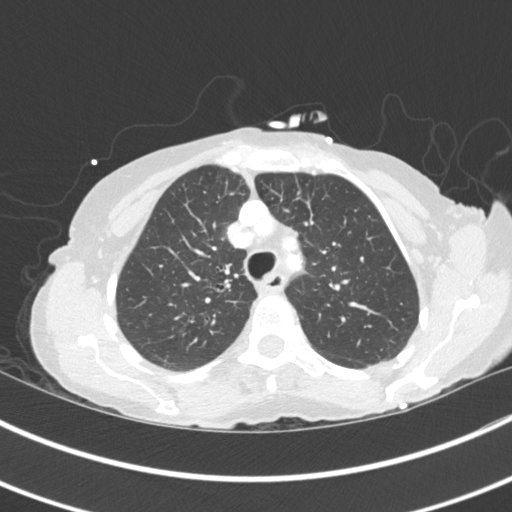
[im 148/175  lung]
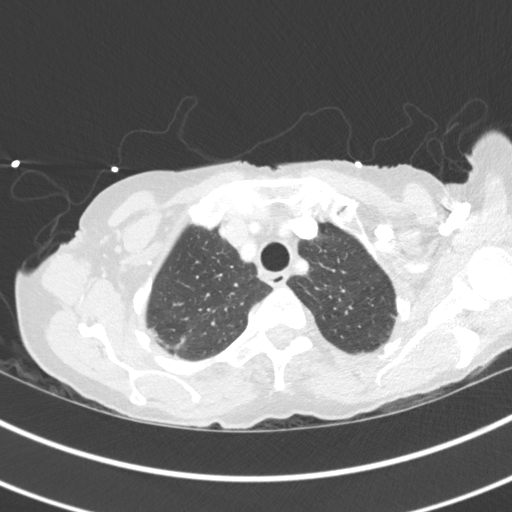
[im 161/175  lung]
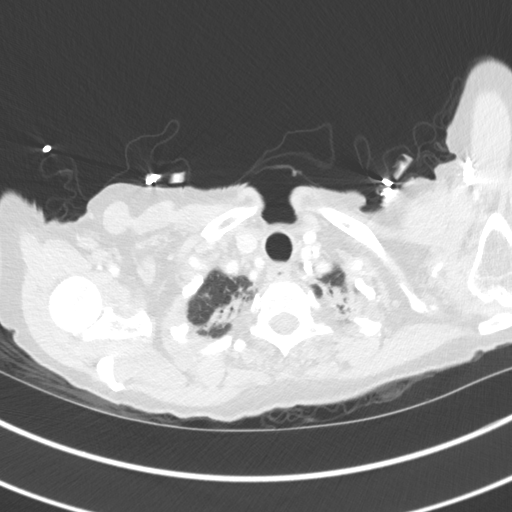

[Series 5: coronal · coronal · 0.58mm/px · 3 of 104 slices shown]
[im 21/104  lung]
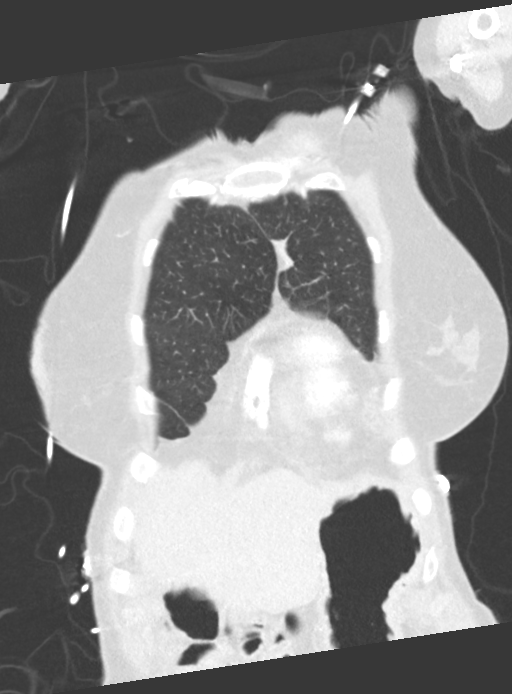
[im 42/104  lung]
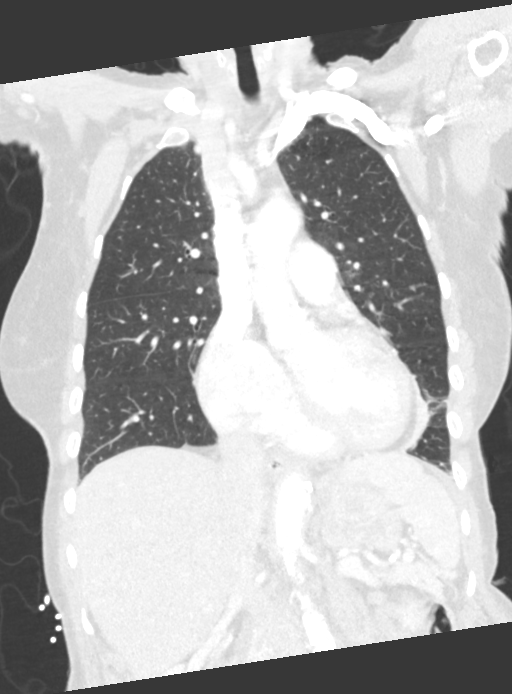
[im 62/104  lung]
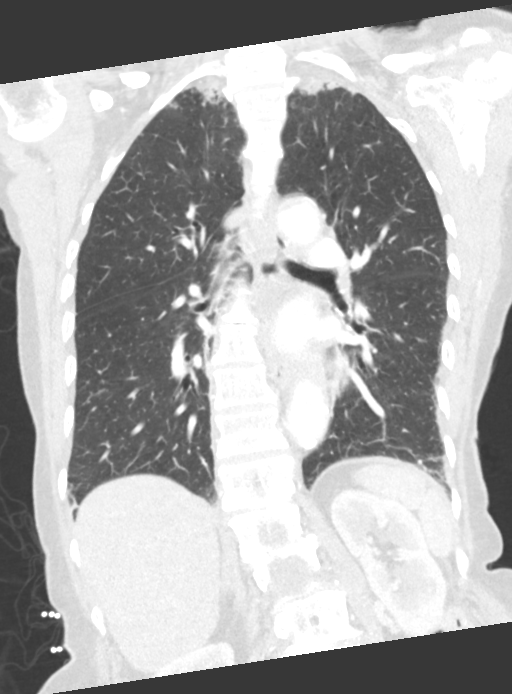

[15 of 36 positions shown; findings below may reference images not displayed]

RADIATION DOSE REDUCTION: This exam was performed according to the
departmental dose-optimization program which includes automated
exposure control, adjustment of the mA and/or kV according to
patient size and/or use of iterative reconstruction technique.

CONTRAST:  75mL OMNIPAQUE IOHEXOL 300 MG/ML  SOLN
FINDINGS: Cardiovascular: No cardiomegaly or pericardial effusion. There is
moderate atherosclerotic calcification of the thoracic aorta. No
aneurysmal dilatation or dissection. The origins of the great
vessels of the aortic arch appear patent as visualized. No pulmonary
artery embolus identified.

Mediastinum/Nodes: There is no hilar or mediastinal adenopathy. The
esophagus is grossly unremarkable. No mediastinal fluid collection.

Lungs/Pleura: Bibasilar and biapical subpleural scarring. No focal
consolidation, pleural effusion, or pneumothorax. No discrete
pulmonary nodule identified. The nodular density seen on the earlier
radiograph corresponds to the apical scarring. The central airways
are patent.

Upper Abdomen: Cirrhosis.

Musculoskeletal: Osteopenia with scoliosis and degenerative changes.
No acute osseous pathology.
IMPRESSION: 1. No acute intrathoracic pathology. No discrete pulmonary nodule
identified.
2. Cirrhosis.
3. Aortic Atherosclerosis ([id number]-PYQ.Q).

## 2024-01-14 IMAGING — CT CT CERVICAL SPINE W/O CM
3 of 4 series · 12 of 33 positions shown, 14 images · non-contrast
Comparison: 10/22/2011

CLINICAL DATA: Fall.  Blunt head trauma.  Dizziness.  Neck pain.



[Series 6: sagittal bone · sagittal · 0.23mm/px · 5 of 61 slices shown, 6 images]
[im 21/61  bone]
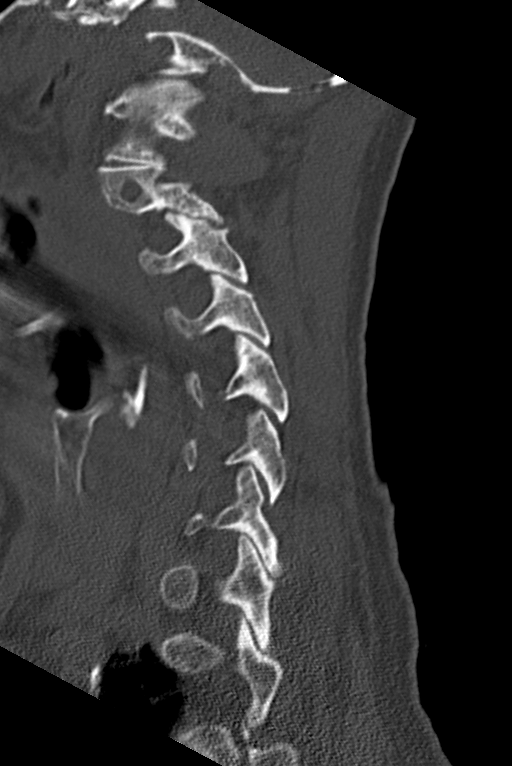
[im 26/61  bone]
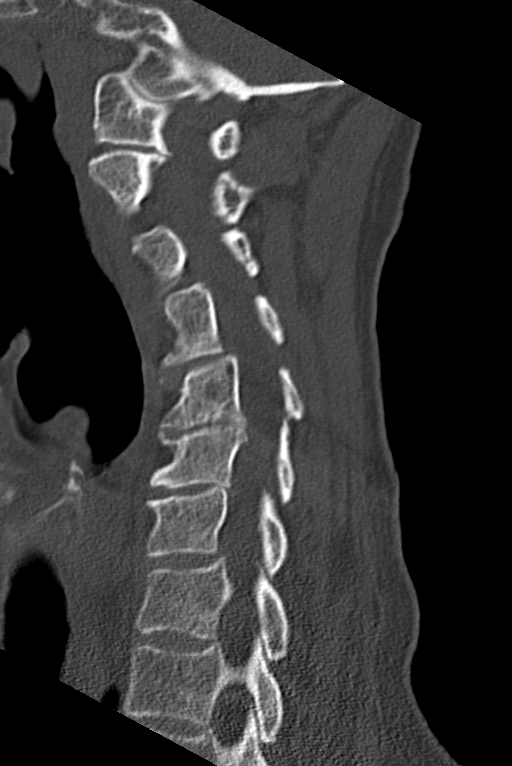
[im 31/61  soft-tissue]
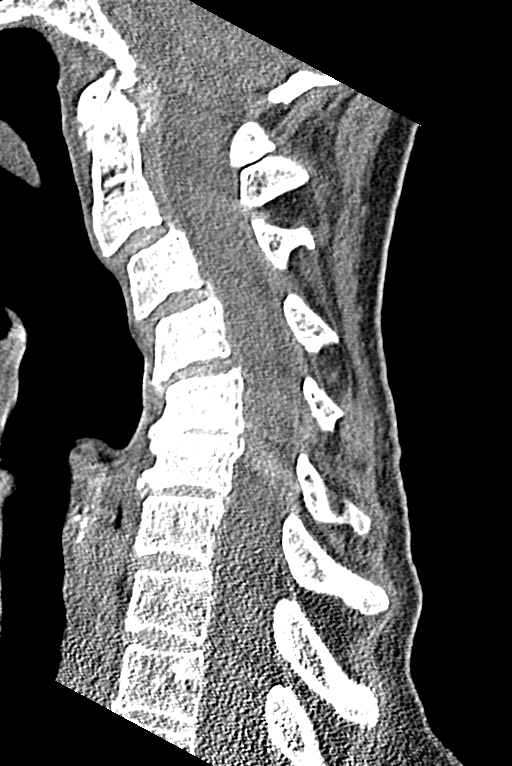
[im 31/61  bone]
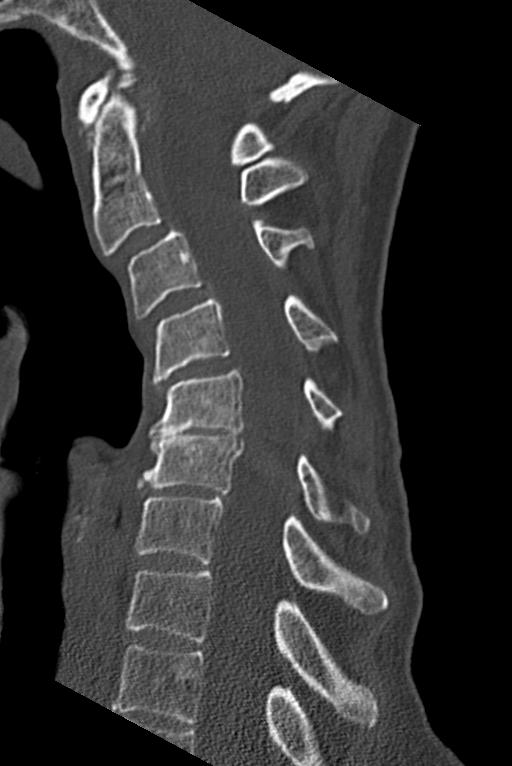
[im 36/61  bone]
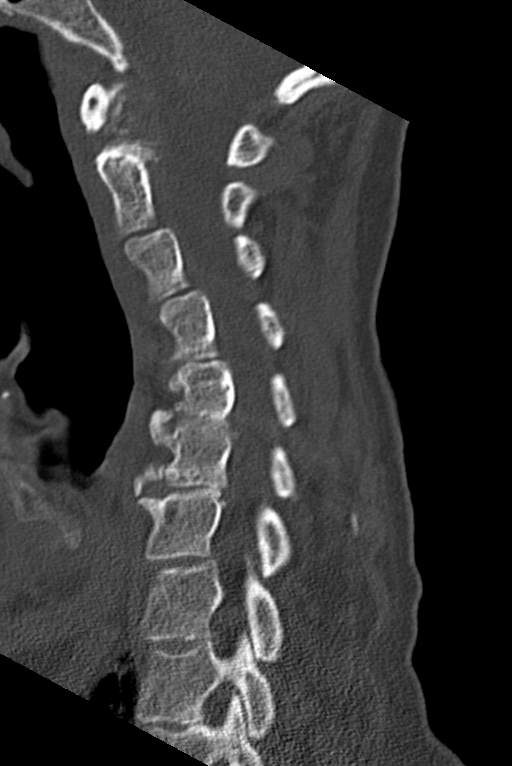
[im 41/61  bone]
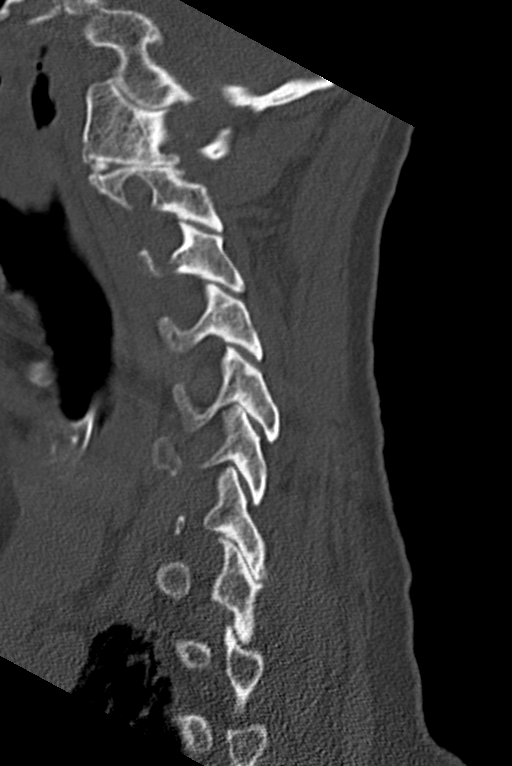

[Series 7: coronal bone · coronal · 0.24mm/px · 3 of 59 slices shown]
[im 14/59  bone]
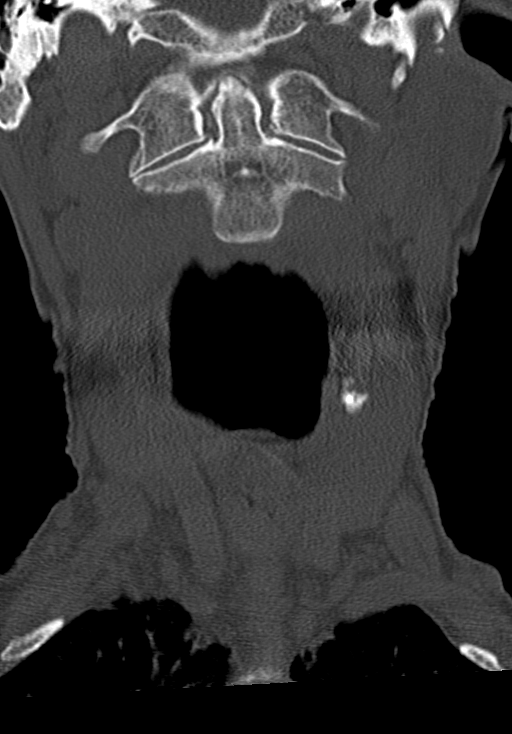
[im 24/59  bone]
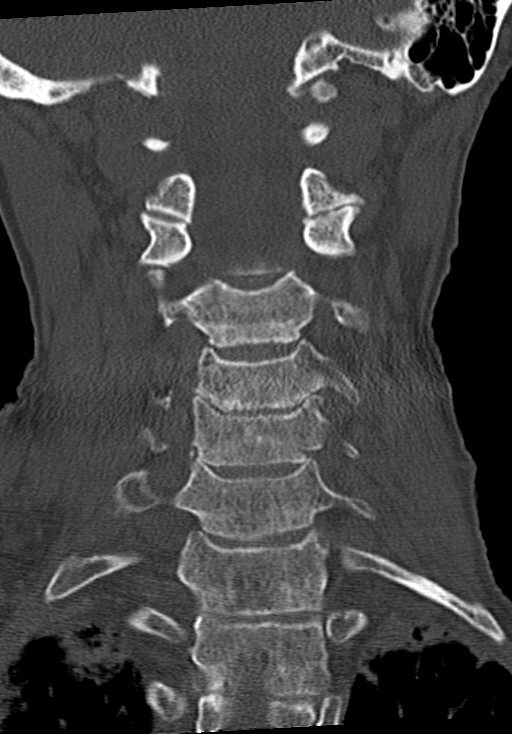
[im 35/59  bone]
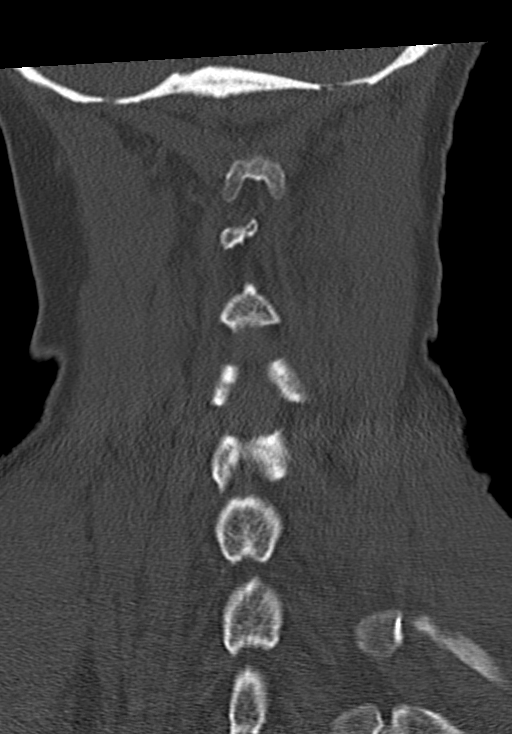

[Series 8: orthogonal bone · axial · 0.23mm/px · z∈[+1074,+1183]mm · 4 of 88 slices shown, 5 images]
[im 13/88  soft-tissue]
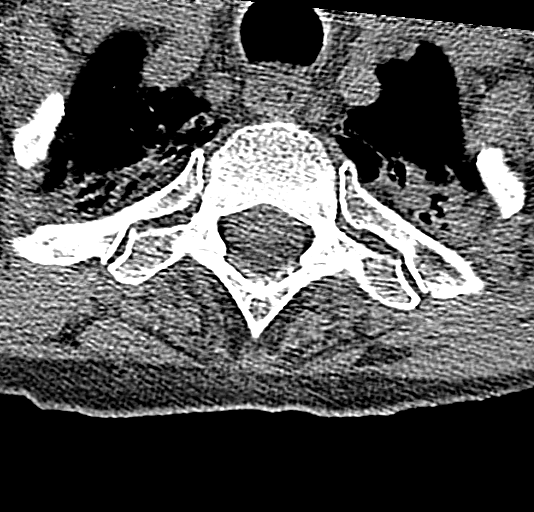
[im 13/88  bone]
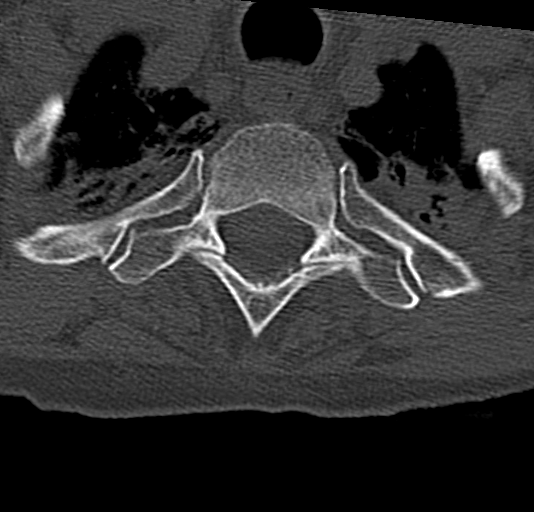
[im 38/88  bone]
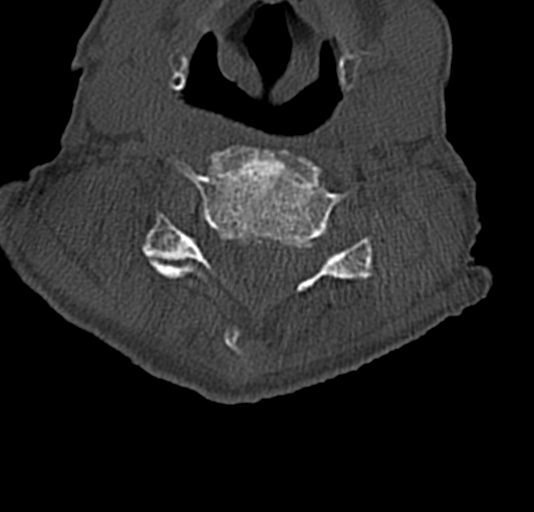
[im 50/88  bone]
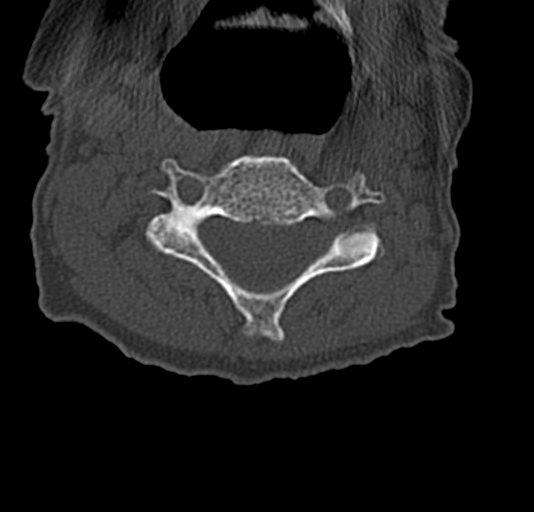
[im 75/88  bone]
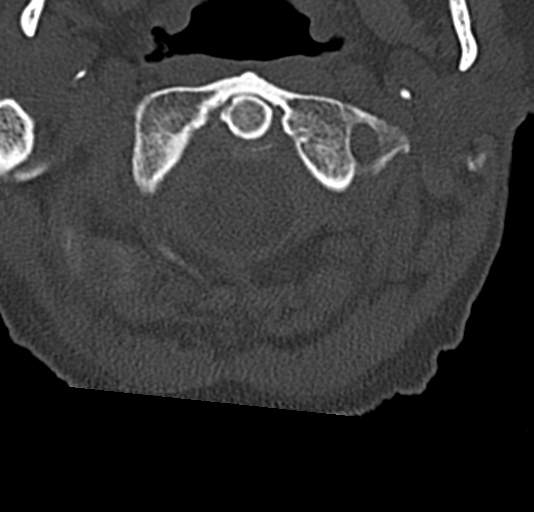

[12 of 33 positions shown; findings below may reference images not displayed]

FINDINGS: CT HEAD FINDINGS

Brain: No evidence of intracranial hemorrhage, acute infarction,
hydrocephalus, extra-axial collection, or mass lesion/mass effect.
Mild diffuse cerebral atrophy again noted

Vascular:  No hyperdense vessel or other acute findings.

Skull: No evidence of fracture or other significant bone
abnormality.

Sinuses/Orbits:  No acute findings.

Other: None.

CT CERVICAL SPINE FINDINGS

Alignment: Normal. Increased cervical kyphosis is seen since prior
study, which may be positional or due to muscle spasm.

Skull base and vertebrae: No acute fracture. No primary bone lesion
or focal pathologic process.

Soft tissues and spinal canal: No prevertebral fluid or swelling. No
visible canal hematoma.

Disc levels: Severe degenerative disc disease is again seen at C5-6
and mild-to-moderate degenerative disc disease is seen at C6-7.

Upper chest: No acute findings.

Other: None.
IMPRESSION: No acute intracranial abnormality.  Mild cerebral atrophy.

No evidence of acute cervical spine fracture or subluxation.

Cervical kyphosis, which may be positional or due to muscle spasm.
Suggest clinical correlation.

Degenerative spondylosis, as described above.

## 2024-01-14 IMAGING — DX DG CHEST 1V PORT
1 series · 1 of 1 positions shown · non-contrast
Comparison: Chest radiograph 02/06/2009

CLINICAL DATA: Questionable sepsis.

EXAM:
PORTABLE CHEST 1 VIEW

[chest ap]
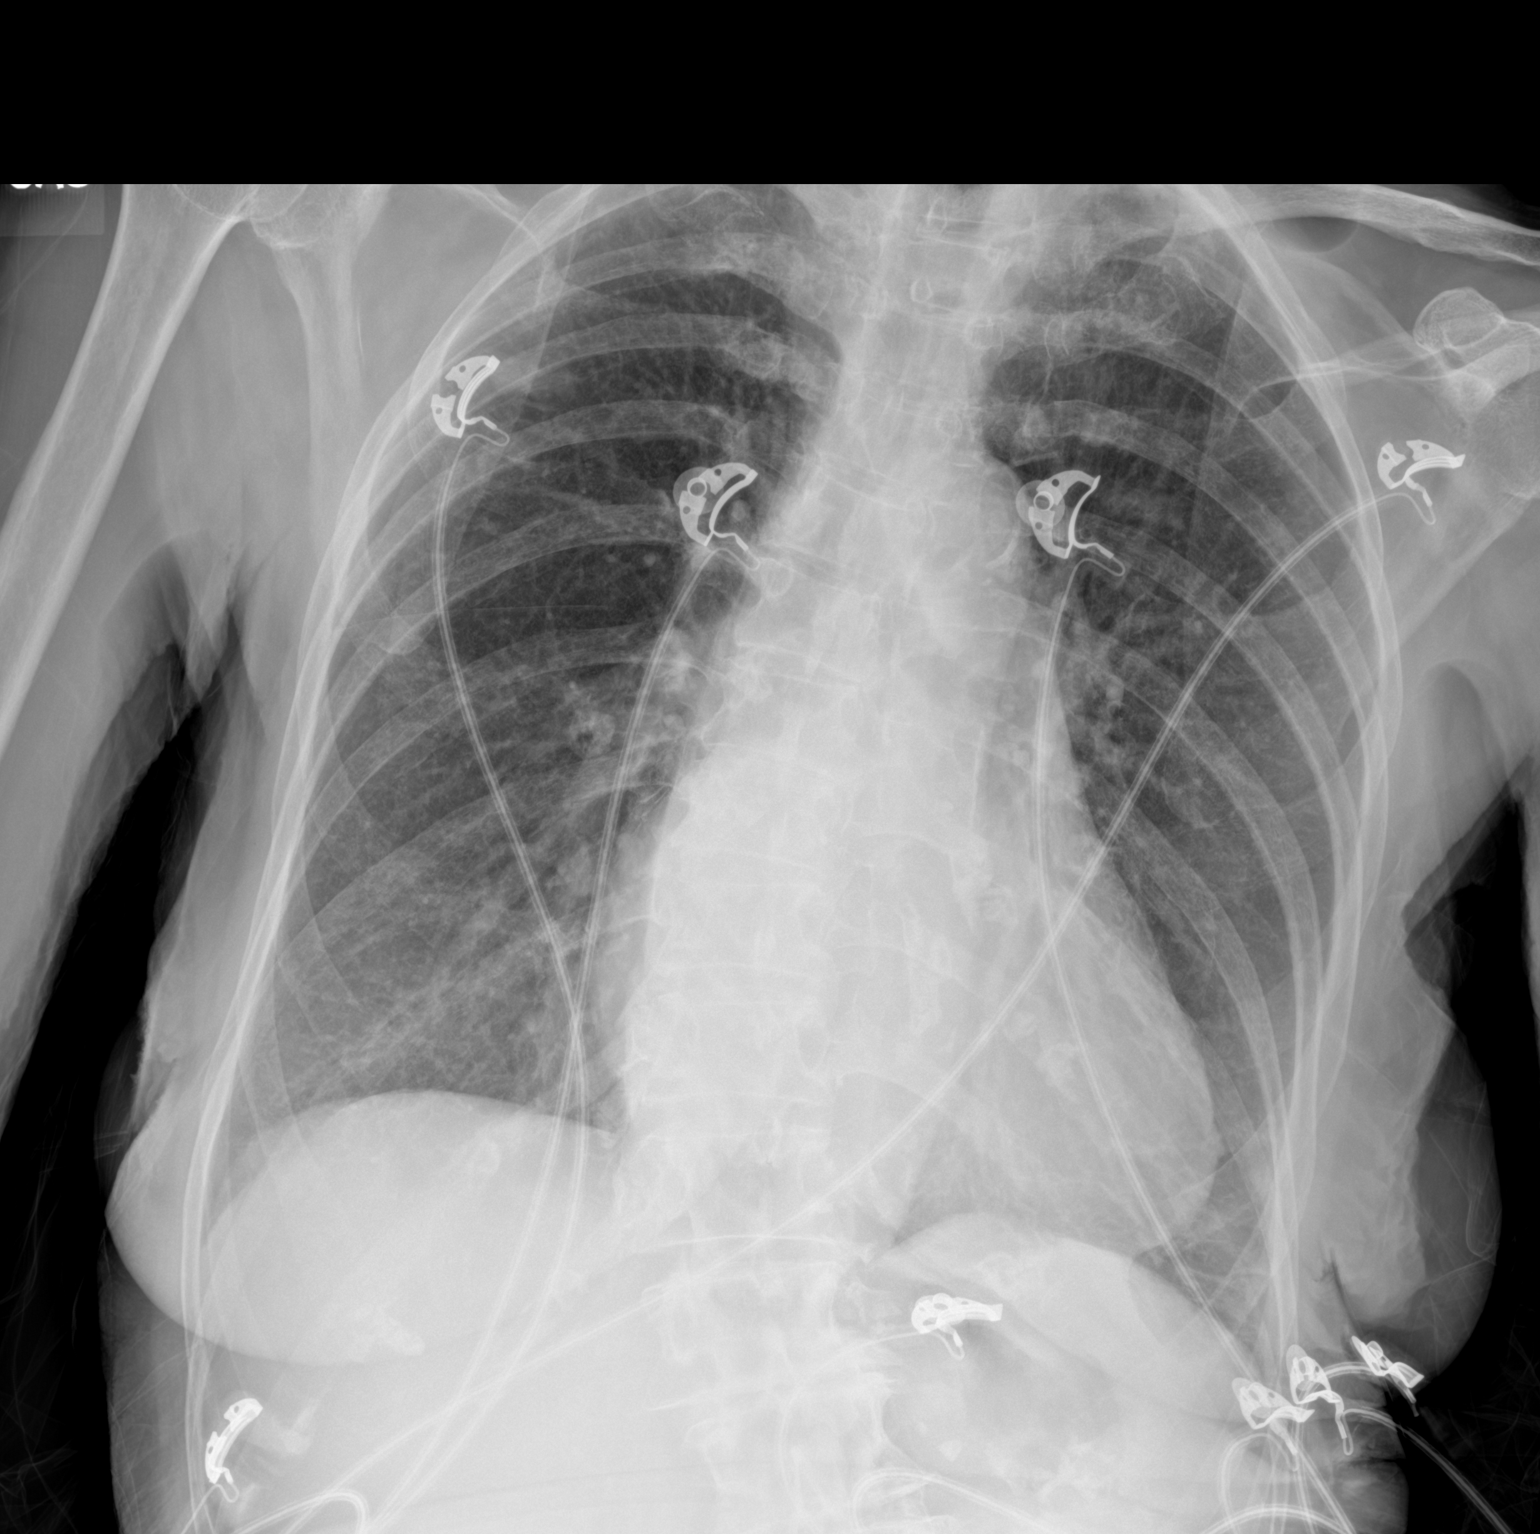

[1 of 1 positions shown; findings below may reference images not displayed]

FINDINGS: The heart size and mediastinal contours are within normal limits.
Aortic calcifications. A 1.8 cm nodular density overlies the right
upper lobe at the level of the posterior fifth rib. Otherwise, no
focal consolidation, pleural effusion, or pneumothorax. No acute
osseous abnormality. Mild rightward curvature of the thoracic spine.
IMPRESSION: No acute cardiopulmonary abnormality.

A 1.8 cm nodular density overlying the right upper lobe may
represent a pulmonary nodule. Recommend CT chest for further
evaluation.

Aortic Atherosclerosis ([id number].P).

## 2024-04-12 ENCOUNTER — Encounter: Payer: Self-pay | Admitting: Family Medicine

## 2024-04-12 ENCOUNTER — Ambulatory Visit (INDEPENDENT_AMBULATORY_CARE_PROVIDER_SITE_OTHER): Admitting: Family Medicine

## 2024-04-12 VITALS — BP 128/54 | HR 65 | Resp 14 | Ht 61.0 in | Wt 94.7 lb

## 2024-04-12 DIAGNOSIS — R634 Abnormal weight loss: Secondary | ICD-10-CM

## 2024-04-12 DIAGNOSIS — E039 Hypothyroidism, unspecified: Secondary | ICD-10-CM

## 2024-04-12 DIAGNOSIS — F418 Other specified anxiety disorders: Secondary | ICD-10-CM | POA: Diagnosis not present

## 2024-04-12 DIAGNOSIS — E876 Hypokalemia: Secondary | ICD-10-CM | POA: Diagnosis not present

## 2024-04-12 DIAGNOSIS — I10 Essential (primary) hypertension: Secondary | ICD-10-CM | POA: Diagnosis not present

## 2024-04-12 NOTE — Patient Instructions (Signed)
 Isabel Myers  Please review the attached list of medications and notify my office if there are any errors.   . Please bring all of your medications to every appointment so we can make sure that our medication list is the same as yours.

## 2024-04-12 NOTE — Progress Notes (Signed)
 Established patient visit   Patient: Isabel Myers   DOB: 12/25/1929   89 y.o. Female  MRN: [id number] Visit Date: 04/12/2024  Today's healthcare provider: Nancyann Perry, MD   Chief Complaint  Patient presents with   Medical Management of Chronic Issues    6 mon f/u.  No longer drives anymore. Getting harder to move around. Under a lot of stress from moving.     Subjective    Discussed the use of AI scribe software for clinical note transcription with the patient, who gave verbal consent to proceed.  History of Present Illness   Isabel Myers is a 89 year old female with hypertension who presents with weight loss and anxiety following a recent move. She is accompanied by her daughter, Leita.  She has experienced noticeable weight loss despite stable eating habits, including a preference for vegetables and fish, and often taking home half of her meals. Her daughter has also observed this change.  She has been experiencing increased anxiety since moving from [place] cottage to an apartment in the main building at the end of July, approximately two months ago. The move has been stressful due to differences in appliances and the overall environment. Her daughter notes increased anxiety and fixation on tasks since the move, which is a change from her previous state. She has a history of using Xanax for anxiety, but it was not well-tolerated.  No trouble with breathing, chest pain, or shortness of breath. She continues to walk primarily indoors and uses a walker to aid her mobility. She has not experienced any falls and feels she gets around well with the walker. She prefers walking for exercise despite friends advising her to use a scooter.  She reports no issues with sleep, stating she falls asleep quickly and wakes up around six o'clock each morning. She has not had any trouble sleeping despite the recent changes in her living situation.  She recently received a prescription  refill. She expresses frustration with the current process of contacting the pharmacy, which involves listening to automated messages.     Lab Results  Component Value Date   TSH 1.360 05/05/2023   Lab Results  Component Value Date   NA 133 (L) 05/05/2023   K 3.7 05/05/2023   CREATININE 0.77 05/05/2023   EGFR 73 05/05/2023   GLUCOSE 78 05/05/2023     Medications: Outpatient Medications Prior to Visit  Medication Sig   amLODipine  (NORVASC ) 2.5 MG tablet TAKE 1 TABLET BY MOUTH DAILY   Calcium Carbonate (CALCIUM 600 PO) Take 1 tablet by mouth daily.   levothyroxine  (SYNTHROID ) 75 MCG tablet TAKE 1 TABLET BY MOUTH ONCE DAILY. TAKE ON EMPTY STOMACH WITH A GLASS OF WATER AT LEAST 30-60 MINUTES BEFORE BREAKFAST   Multiple Vitamins-Minerals (PRESERVISION AREDS 2 PO) Take 2 tablets by mouth daily at 6 (six) AM.   triamterene -hydrochlorothiazide  (MAXZIDE) 75-50 MG tablet TAKE 1 TABLET BY MOUTH DAILY   No facility-administered medications prior to visit.   Review of Systems  Constitutional:  Negative for appetite change, chills, fatigue and fever.  Respiratory:  Negative for chest tightness and shortness of breath.   Cardiovascular:  Negative for chest pain and palpitations.  Gastrointestinal:  Negative for abdominal pain, nausea and vomiting.  Neurological:  Negative for dizziness and weakness.       Objective    BP (!) 128/54   Pulse 65   Resp 14   Ht 5' 1 (1.549 m)  Wt 94 lb 11.2 oz (43 kg)   SpO2 98%   BMI 17.89 kg/m   Physical Exam   General: Appearance:    Thin female in no acute distress  Eyes:    PERRL, conjunctiva/corneas clear, EOM's intact       Lungs:     Clear to auscultation bilaterally, respirations unlabored  Heart:    Normal heart rate. Normal rhythm. No murmurs, rubs, or gallops.    MS:   All extremities are intact.    Neurologic:   Awake, alert, oriented x 3. No apparent focal neurological defect.       Assessment & Plan    1. Essential hypertension  (Primary) Well controlled.  Continue current medications.   - CBC - Comprehensive metabolic panel with GFR  2. Weight loss Check labs as above and below. [place] been more [place]   3. Hypothyroidism, unspecified type On thyroid  replacement.  - T4 AND TSH  4. Hypokalemia Check electrolytes as above.   5. Situational anxiety Onset with move to apartment about 2 months ago and improved a bit the last few week. Discussed option of trial of SSRI or SNRI, but will give her more time to adjust to change in home environment. Advised they could call and let me know if they decide to try medication.   Return in about 6 months (around 10/11/2024) for Hypertension.         Nancyann Perry, MD  [facility] [phone] (phone) [phone] (fax)  [facility]

## 2024-04-13 ENCOUNTER — Ambulatory Visit: Payer: Self-pay | Admitting: Family Medicine

## 2024-04-13 LAB — CBC
Hematocrit: 41 % (ref 34.0–46.6)
Hemoglobin: 13.1 g/dL (ref 11.1–15.9)
MCH: 29.6 pg (ref 26.6–33.0)
MCHC: 32 g/dL (ref 31.5–35.7)
MCV: 93 fL (ref 79–97)
Platelets: 332 x10E3/uL (ref 150–450)
RBC: 4.43 x10E6/uL (ref 3.77–5.28)
RDW: 13.2 % (ref 11.7–15.4)
WBC: 10.4 x10E3/uL (ref 3.4–10.8)

## 2024-04-13 LAB — COMPREHENSIVE METABOLIC PANEL WITH GFR
ALT: 17 IU/L (ref 0–32)
AST: 27 IU/L (ref 0–40)
Albumin: 4.5 g/dL (ref 3.6–4.6)
Alkaline Phosphatase: 55 IU/L (ref 48–129)
BUN/Creatinine Ratio: 29 — ABNORMAL HIGH (ref 12–28)
BUN: 23 mg/dL (ref 10–36)
Bilirubin Total: 0.4 mg/dL (ref 0.0–1.2)
CO2: 26 mmol/L (ref 20–29)
Calcium: 9.7 mg/dL (ref 8.7–10.3)
Chloride: 91 mmol/L — ABNORMAL LOW (ref 96–106)
Creatinine, Ser: 0.8 mg/dL (ref 0.57–1.00)
Globulin, Total: 2.2 g/dL (ref 1.5–4.5)
Glucose: 83 mg/dL (ref 70–99)
Potassium: 3.9 mmol/L (ref 3.5–5.2)
Sodium: 131 mmol/L — ABNORMAL LOW (ref 134–144)
Total Protein: 6.7 g/dL (ref 6.0–8.5)
eGFR: 69 mL/min/1.73 (ref >59)

## 2024-04-13 LAB — T4 AND TSH
T4, Total: 8.6 ug/dL (ref 4.5–12.0)
TSH: 3.14 u[IU]/mL (ref 0.450–4.500)

## 2024-05-03 ENCOUNTER — Other Ambulatory Visit: Payer: Self-pay | Admitting: Family Medicine

## 2024-05-03 DIAGNOSIS — I10 Essential (primary) hypertension: Secondary | ICD-10-CM

## 2024-05-05 ENCOUNTER — Ambulatory Visit: Payer: Self-pay | Admitting: Family Medicine

## 2024-08-09 ENCOUNTER — Ambulatory Visit: Payer: Self-pay

## 2024-08-09 DIAGNOSIS — Z0000 Encounter for general adult medical examination without abnormal findings: Secondary | ICD-10-CM | POA: Diagnosis not present

## 2024-08-09 NOTE — Progress Notes (Signed)
 "  Chief Complaint  Patient presents with   Medicare Wellness     Subjective:   Isabel Myers is a 89 y.o. female who presents for a Medicare Annual Wellness Visit.  Visit info / Clinical Intake: Medicare Wellness Visit Type:: Subsequent Annual Wellness Visit Persons participating in visit and providing information:: patient Medicare Wellness Visit Mode:: Telephone If telephone:: video declined Since this visit was completed virtually, some vitals may be partially provided or unavailable. Missing vitals are due to the limitations of the virtual format.: Unable to obtain vitals - no equipment If Telephone or Video please confirm:: I connected with patient using audio/video enable telemedicine. I verified patient identity with two identifiers, discussed telehealth limitations, and patient agreed to proceed. Patient Location:: HOME Provider Location:: HOME OFFICE Interpreter Needed?: No Pre-visit prep was completed: yes AWV questionnaire completed by patient prior to visit?: no Living arrangements:: (!) lives alone; in retirement community Patient's Overall Health Status Rating: good Typical amount of pain: some (ARTHRITIS) Does pain affect daily life?: (!) yes Are you currently prescribed opioids?: no  Dietary Habits and Nutritional Risks How many meals a day?: 3 Eats fruit and vegetables daily?: yes Most meals are obtained by: preparing own meals; having others provide food In the last 2 weeks, have you had any of the following?: none Diabetic:: no  Functional Status Activities of Daily Living (to include ambulation/medication): Independent Ambulation: Independent with device- listed below Home Assistive Devices/Equipment: Walker (specify Type) (ROLLATOR) Medication Administration: Independent Home Management (perform basic housework or laundry): Needs assistance (comment) (S[employer] CLE[employer] Q2WEEKS) Manage your own finances?: yes Primary transportation is: family /  friends Concerns about vision?: (!) yes (BIFOCALS, MACULAR DEGENERATION- DR.PORFILIO- YEARLY) Concerns about hearing?: (!) yes Uses hearing aids?: (!) yes (BOTH EARS) Hear whispered voice?: (!) no *in-person visit only*  Fall Screening Falls in the past year?: 1 Number of falls in past year: 0 Was there an injury with Fall?: 0 Fall Risk Category Calculator: 1 Patient Fall Risk Level: Low Fall Risk  Fall Risk Patient at Risk for Falls Due to: Impaired mobility Fall risk Follow up: Falls evaluation completed; Falls prevention discussed  Home and Transportation Safety: All rugs have non-skid backing?: yes All stairs or steps have railings?: N/A, no stairs Grab bars in the bathtub or shower?: yes Have non-skid surface in bathtub or shower?: yes Good home lighting?: yes Regular seat belt use?: yes Hospital stays in the last year:: no  Cognitive Assessment Difficulty concentrating, remembering, or making decisions? : no Will 6CIT or Mini Cog be Completed: yes What year is it?: 0 points What month is it?: 0 points Give patient an address phrase to remember (5 components): [address] [place], [place] About what time is it?: 0 points Count backwards from 20 to 1: 0 points Say the months of the year in reverse: 0 points Repeat the address phrase from earlier: 0 points 6 CIT Score: 0 points  Advance Directives (For Healthcare) Does Patient Have a Medical Advance Directive?: Yes Does patient want to make changes to medical advance directive?: No - Patient declined Type of Advance Directive: Healthcare Power of [place]; Living will Copy of Healthcare Power of Attorney in Chart?: Yes - validated most recent copy scanned in chart (See row information) Copy of Living Will in Chart?: Yes - validated most recent copy scanned in chart (See row information)  Reviewed/Updated  Reviewed/Updated: Reviewed All (Medical, Surgical, Family, Medications, Allergies, Care Teams, Patient  Goals)    Allergies (verified)  Amlodipine    Current Medications (verified) Outpatient Encounter Medications as of 08/09/2024  Medication Sig   amLODipine  (NORVASC ) 2.5 MG tablet TAKE 1 TABLET BY MOUTH DAILY   Calcium Carbonate (CALCIUM 600 PO) Take 1 tablet by mouth daily.   levothyroxine  (SYNTHROID ) 75 MCG tablet TAKE 1 TABLET BY MOUTH ONCE DAILY. TAKE ON EMPTY STOMACH WITH A GLASS OF WATER AT LEAST 30-60 MINUTES BEFORE BREAKFAST   Multiple Vitamins-Minerals (PRESERVISION AREDS 2 PO) Take 2 tablets by mouth daily at 6 (six) AM.   triamterene -hydrochlorothiazide  (MAXZIDE) 75-50 MG tablet TAKE 1 TABLET BY MOUTH DAILY   No facility-administered encounter medications on file as of 08/09/2024.    History: Past Medical History:  Diagnosis Date   Hyperlipidemia    Hypertension    Thyroid  disease    hypothyroid   Past Surgical History:  Procedure Laterality Date   ABDOMINAL HYSTERECTOMY  1987   with BSO   Bladder tack  2009   Dr. Tad   BUNIONECTOMY     CATARACT EXTRACTION, BILATERAL     CT Cervical Spine  10/22/2011   negative   CT Scan of head  10/22/2011   without contrast, [facility], Normal   THYROIDECTOMY  1937   SUBTOTAL   TONSILLECTOMY AND ADENOIDECTOMY     Family History  Problem Relation Age of Onset   Memory loss Sister        short term memory loss   Social History   Occupational History   Occupation: Retired  Tobacco Use   Smoking status: Former    Current packs/day: 0.00    Average packs/day: 0.3 packs/day for 14.0 years (3.5 ttl pk-yrs)    Types: Cigarettes    Start date: 57    Quit date: 1968    Years since quitting: 58.1   Smokeless tobacco: Never   Tobacco comments:    Quit in the 1970s  Vaping Use   Vaping status: Never Used  Substance and Sexual Activity   Alcohol use: Yes    Alcohol/week: 4.0 standard drinks of alcohol    Types: 4 Glasses of wine per week    Comment: 1 glass of wine 4 days a week   Drug use: No   Sexual activity:  Not on file   Tobacco Counseling Counseling given: Not Answered Tobacco comments: Quit in the 1970s  SDOH Screenings   Food Insecurity: No Food Insecurity (08/09/2024)  Housing: Unknown (08/09/2024)  Transportation Needs: No Transportation Needs (08/09/2024)  Utilities: Not At Risk (08/09/2024)  Alcohol Screen: Low Risk (08/04/2023)  Depression (PHQ2-9): Low Risk (08/09/2024)  Financial Resource Strain: Low Risk (08/04/2023)  Physical Activity: Insufficiently Active (08/09/2024)  Social Connections: Moderately Isolated (08/09/2024)  Stress: No Stress Concern Present (08/09/2024)  Tobacco Use: Medium Risk (08/09/2024)  Health Literacy: Adequate Health Literacy (08/09/2024)   See flowsheets for full screening details  Depression Screen PHQ 2 & 9 Depression Scale- Over the past 2 weeks, how often have you been bothered by any of the following problems? Little interest or pleasure in doing things: 0 Feeling down, depressed, or hopeless (PHQ Adolescent also includes...irritable): 0 PHQ-2 Total Score: 0 Trouble falling or staying asleep, or sleeping too much: 0 Feeling tired or having little energy: 0 Poor appetite or overeating (PHQ Adolescent also includes...weight loss): 0 Feeling bad about yourself - or that you are a failure or have let yourself or your family down: 0 Trouble concentrating on things, such as reading the newspaper or watching television (PHQ Adolescent also includes...like school work):  0 Moving or speaking so slowly that other people could have noticed. Or the opposite - being so fidgety or restless that you have been moving around a lot more than usual: 0 Thoughts that you would be better off dead, or of hurting yourself in some way: 0 PHQ-9 Total Score: 0 If you checked off any problems, how difficult have these problems made it for you to do your work, take care of things at home, or get along with other people?: Not difficult at all  Depression Treatment Depression  Interventions/Treatment : EYV7-0 Score <4 Follow-up Not Indicated     Goals Addressed             This Visit's Progress    Cut out extra servings               Objective:    There were no vitals filed for this visit. There is no height or weight on file to calculate BMI.  Hearing/Vision screen No results found. Immunizations and Health Maintenance Health Maintenance  Topic Date Due   COVID-19 Vaccine (8 - 2025-26 season) 03/13/2024   Medicare Annual Wellness (AWV)  08/09/2025   Bone Density Scan  08/18/2028   DTaP/Tdap/Td (5 - Td or Tdap) 08/19/2033   Pneumococcal Vaccine: 50+ Years  Completed   Influenza Vaccine  Completed   Zoster Vaccines- Shingrix  Completed   Meningococcal B Vaccine  Aged Out        Assessment/Plan:  This is a routine wellness examination for Isabel Myers.  Patient Care Team: Gasper Nancyann BRAVO, MD as PCP - General (Family Medicine) Dingeldein, Elspeth, MD (Ophthalmology) Ninette Lauraine BROCKS, MD as Referring Physician (Audiology)  I have personally reviewed and noted the following in the patients chart:   Medical and social history Use of alcohol, tobacco or illicit drugs  Current medications and supplements including opioid prescriptions. Functional ability and status Nutritional status Physical activity Advanced directives List of other physicians Hospitalizations, surgeries, and ER visits in previous 12 months Vitals Screenings to include cognitive, depression, and falls Referrals and appointments  No orders of the defined types were placed in this encounter.  In addition, I have reviewed and discussed with patient certain preventive protocols, quality metrics, and best practice recommendations. A written personalized care plan for preventive services as well as general preventive health recommendations were provided to patient.   Jhonnie GORMAN Das, LPN   8/71/7973   Return in 1 year (on 08/09/2025).  After Visit Summary: (MyChart)  Due to this being a telephonic visit, the after visit summary with patients personalized plan was offered to patient via MyChart   Nurse Notes: UTD ON SHOTS; AGED OUT OF BDS, MAMMOGRAM & COLONOSCOPY  No voiced or noted concerns at this time  "

## 2024-08-09 NOTE — Patient Instructions (Signed)
 Isabel Myers,  Thank you for taking the time for your Medicare Wellness Visit. I appreciate your continued commitment to your health goals. Please review the care plan we discussed, and feel free to reach out if I can assist you further.  Please note that Annual Wellness Visits do not include a physical exam. Some assessments may be limited, especially if the visit was conducted virtually. If needed, we may recommend an in-person follow-up with your provider.  Ongoing Care Seeing your primary care provider every 3 to 6 months helps us  monitor your health and provide consistent, personalized care. 09/13/24 @ 10:40 AM APPT W/ DR.FISHER  Referrals If a referral was made during today's visit and you haven't received any updates within two weeks, please contact the referred provider directly to check on the status.  Recommended Screenings:  Health Maintenance  Topic Date Due   COVID-19 Vaccine (8 - 2025-26 season) 03/13/2024   Medicare Annual Wellness Visit  08/09/2025   Osteoporosis screening with Bone Density Scan  08/18/2028   DTaP/Tdap/Td vaccine (5 - Td or Tdap) 08/19/2033   Pneumococcal Vaccine for age over 60  Completed   Flu Shot  Completed   Zoster (Shingles) Vaccine  Completed   Meningitis B Vaccine  Aged Out       08/09/2024    1:13 PM  Advanced Directives  Does Patient Have a Medical Advance Directive? Yes  Type of Estate Agent of [place];Living will  Does patient want to make changes to medical advance directive? No - Patient declined  Copy of Healthcare Power of Attorney in Chart? Yes - validated most recent copy scanned in chart (See row information)    Vision: Annual vision screenings are recommended for early detection of glaucoma, cataracts, and diabetic retinopathy. These exams can also reveal signs of chronic conditions such as diabetes and high blood pressure.  Dental: Annual dental screenings help detect early signs of oral cancer, gum disease,  and other conditions linked to overall health, including heart disease and diabetes.  Please see the attached documents for additional preventive care recommendations.  NEXT AWV 08/14/25 @ 11:30 AM IN PERSON

## 2024-09-13 ENCOUNTER — Ambulatory Visit: Admitting: Family Medicine

## 2025-08-14 ENCOUNTER — Ambulatory Visit
